# Patient Record
Sex: Male | Born: 1940 | Race: White | Hispanic: No | Smoking: Current every day smoker
Health system: Southern US, Community
[De-identification: ages and names within clinical notes are randomized; demographics above are authoritative.]

## PROBLEM LIST (undated history)

## (undated) DIAGNOSIS — I739 Peripheral vascular disease, unspecified: Secondary | ICD-10-CM

## (undated) DIAGNOSIS — Z85820 Personal history of malignant melanoma of skin: Secondary | ICD-10-CM

## (undated) DIAGNOSIS — C439 Malignant melanoma of skin, unspecified: Secondary | ICD-10-CM

## (undated) DIAGNOSIS — I700 Atherosclerosis of aorta: Secondary | ICD-10-CM

## (undated) DIAGNOSIS — E1142 Type 2 diabetes mellitus with diabetic polyneuropathy: Secondary | ICD-10-CM

## (undated) DIAGNOSIS — K219 Gastro-esophageal reflux disease without esophagitis: Secondary | ICD-10-CM

## (undated) DIAGNOSIS — I10 Essential (primary) hypertension: Secondary | ICD-10-CM

## (undated) DIAGNOSIS — I2119 ST elevation (STEMI) myocardial infarction involving other coronary artery of inferior wall: Secondary | ICD-10-CM

---

## 2001-06-01 ENCOUNTER — Emergency Department (HOSPITAL_COMMUNITY): Admission: EM | Admit: 2001-06-01 | Discharge: 2001-06-01 | Payer: Self-pay | Admitting: Emergency Medicine

## 2001-06-01 ENCOUNTER — Encounter: Payer: Self-pay | Admitting: Emergency Medicine

## 2003-01-17 ENCOUNTER — Encounter: Payer: Self-pay | Admitting: Emergency Medicine

## 2003-01-17 ENCOUNTER — Encounter: Payer: Self-pay | Admitting: Neurology

## 2003-01-17 ENCOUNTER — Emergency Department (HOSPITAL_COMMUNITY): Admission: EM | Admit: 2003-01-17 | Discharge: 2003-01-17 | Payer: Self-pay

## 2003-01-18 ENCOUNTER — Encounter: Payer: Self-pay | Admitting: Neurology

## 2009-08-25 ENCOUNTER — Ambulatory Visit: Payer: Self-pay | Admitting: Diagnostic Radiology

## 2009-08-25 ENCOUNTER — Encounter: Payer: Self-pay | Admitting: Emergency Medicine

## 2009-08-25 ENCOUNTER — Inpatient Hospital Stay (HOSPITAL_COMMUNITY): Admission: EM | Admit: 2009-08-25 | Discharge: 2009-08-27 | Payer: Self-pay | Admitting: Internal Medicine

## 2009-08-26 ENCOUNTER — Encounter (INDEPENDENT_AMBULATORY_CARE_PROVIDER_SITE_OTHER): Payer: Self-pay | Admitting: Internal Medicine

## 2011-02-11 ENCOUNTER — Emergency Department (INDEPENDENT_AMBULATORY_CARE_PROVIDER_SITE_OTHER): Payer: Medicare Other

## 2011-02-11 ENCOUNTER — Inpatient Hospital Stay (HOSPITAL_COMMUNITY)
Admission: AD | Admit: 2011-02-11 | Discharge: 2011-02-15 | DRG: 071 | Disposition: A | Discharge status: Home Health | Payer: Medicare Other | Source: Other Acute Inpatient Hospital | Attending: Internal Medicine | Admitting: Internal Medicine

## 2011-02-11 ENCOUNTER — Emergency Department (HOSPITAL_BASED_OUTPATIENT_CLINIC_OR_DEPARTMENT_OTHER)
Admission: EM | Admit: 2011-02-11 | Discharge: 2011-02-11 | Disposition: A | Discharge status: Nursing Home (Includes ICF) | Payer: Medicare Other | Source: Home / Self Care | Attending: Emergency Medicine | Admitting: Emergency Medicine

## 2011-02-11 DIAGNOSIS — W19XXXA Unspecified fall, initial encounter: Secondary | ICD-10-CM | POA: Diagnosis present

## 2011-02-11 DIAGNOSIS — Z8673 Personal history of transient ischemic attack (TIA), and cerebral infarction without residual deficits: Secondary | ICD-10-CM

## 2011-02-11 DIAGNOSIS — S60229A Contusion of unspecified hand, initial encounter: Secondary | ICD-10-CM | POA: Diagnosis present

## 2011-02-11 DIAGNOSIS — E119 Type 2 diabetes mellitus without complications: Secondary | ICD-10-CM | POA: Diagnosis present

## 2011-02-11 DIAGNOSIS — E785 Hyperlipidemia, unspecified: Secondary | ICD-10-CM | POA: Diagnosis present

## 2011-02-11 DIAGNOSIS — I10 Essential (primary) hypertension: Secondary | ICD-10-CM | POA: Diagnosis present

## 2011-02-11 DIAGNOSIS — R262 Difficulty in walking, not elsewhere classified: Secondary | ICD-10-CM | POA: Diagnosis present

## 2011-02-11 DIAGNOSIS — R4182 Altered mental status, unspecified: Secondary | ICD-10-CM

## 2011-02-11 DIAGNOSIS — N390 Urinary tract infection, site not specified: Secondary | ICD-10-CM | POA: Diagnosis present

## 2011-02-11 DIAGNOSIS — Z9181 History of falling: Secondary | ICD-10-CM

## 2011-02-11 DIAGNOSIS — R4789 Other speech disturbances: Secondary | ICD-10-CM | POA: Insufficient documentation

## 2011-02-11 DIAGNOSIS — M79609 Pain in unspecified limb: Secondary | ICD-10-CM

## 2011-02-11 DIAGNOSIS — R55 Syncope and collapse: Secondary | ICD-10-CM

## 2011-02-11 DIAGNOSIS — Y998 Other external cause status: Secondary | ICD-10-CM

## 2011-02-11 DIAGNOSIS — F29 Unspecified psychosis not due to a substance or known physiological condition: Secondary | ICD-10-CM | POA: Insufficient documentation

## 2011-02-11 DIAGNOSIS — S6390XA Sprain of unspecified part of unspecified wrist and hand, initial encounter: Secondary | ICD-10-CM | POA: Diagnosis present

## 2011-02-11 DIAGNOSIS — F0390 Unspecified dementia without behavioral disturbance: Secondary | ICD-10-CM | POA: Diagnosis present

## 2011-02-11 DIAGNOSIS — D649 Anemia, unspecified: Secondary | ICD-10-CM | POA: Diagnosis present

## 2011-02-11 DIAGNOSIS — E538 Deficiency of other specified B group vitamins: Secondary | ICD-10-CM | POA: Diagnosis present

## 2011-02-11 DIAGNOSIS — Z79899 Other long term (current) drug therapy: Secondary | ICD-10-CM | POA: Insufficient documentation

## 2011-02-11 DIAGNOSIS — G9340 Encephalopathy, unspecified: Principal | ICD-10-CM | POA: Diagnosis present

## 2011-02-11 DIAGNOSIS — M7989 Other specified soft tissue disorders: Secondary | ICD-10-CM

## 2011-02-11 DIAGNOSIS — Z85820 Personal history of malignant melanoma of skin: Secondary | ICD-10-CM

## 2011-02-11 HISTORY — DX: Essential (primary) hypertension: I10

## 2011-02-11 LAB — DIFFERENTIAL
Basophils Relative: 0 % (ref 0–1)
Eosinophils Absolute: 0.1 10*3/uL (ref 0.0–0.7)
Eosinophils Relative: 3 % (ref 0–5)
Monocytes Absolute: 0.4 10*3/uL (ref 0.1–1.0)
Monocytes Relative: 9 % (ref 3–12)

## 2011-02-11 LAB — URINALYSIS, ROUTINE W REFLEX MICROSCOPIC
Bilirubin Urine: NEGATIVE
Hgb urine dipstick: NEGATIVE
Ketones, ur: NEGATIVE mg/dL
Nitrite: NEGATIVE
Specific Gravity, Urine: 1.025 (ref 1.005–1.030)
Urobilinogen, UA: 0.2 mg/dL (ref 0.0–1.0)

## 2011-02-11 LAB — COMPREHENSIVE METABOLIC PANEL
ALT: 14 U/L (ref 0–53)
AST: 31 U/L (ref 0–37)
Albumin: 3.6 g/dL (ref 3.5–5.2)
CO2: 25 mEq/L (ref 19–32)
Chloride: 107 mEq/L (ref 96–112)
GFR calc Af Amer: >60 mL/min (ref >60)
GFR calc non Af Amer: >60 mL/min (ref >60)
Potassium: 4.6 mEq/L (ref 3.5–5.1)
Sodium: 142 mEq/L (ref 135–145)
Total Bilirubin: 0.5 mg/dL (ref 0.3–1.2)

## 2011-02-11 LAB — URINE MICROSCOPIC-ADD ON

## 2011-02-11 LAB — CBC
Hemoglobin: 9.4 g/dL — ABNORMAL LOW (ref 13.0–17.0)
Platelets: 247 10*3/uL (ref 150–400)
RBC: 3.54 MIL/uL — ABNORMAL LOW (ref 4.22–5.81)
WBC: 4.7 10*3/uL (ref 4.0–10.5)

## 2011-02-12 ENCOUNTER — Encounter (HOSPITAL_COMMUNITY): Payer: Self-pay | Admitting: Radiology

## 2011-02-12 ENCOUNTER — Other Ambulatory Visit (HOSPITAL_COMMUNITY): Payer: Non-veteran care

## 2011-02-12 ENCOUNTER — Inpatient Hospital Stay (HOSPITAL_COMMUNITY): Payer: Medicare Other

## 2011-02-12 LAB — MAGNESIUM: Magnesium: 1.3 mg/dL — ABNORMAL LOW (ref 1.5–2.5)

## 2011-02-12 LAB — DRUGS OF ABUSE SCREEN W/O ALC, ROUTINE URINE
Amphetamine Screen, Ur: NEGATIVE
Barbiturate Quant, Ur: POSITIVE — AB
Benzodiazepines.: NEGATIVE
Methadone: NEGATIVE
Phencyclidine (PCP): NEGATIVE

## 2011-02-12 LAB — RPR: RPR Ser Ql: NONREACTIVE

## 2011-02-12 LAB — PROTIME-INR
INR: 0.91 (ref 0.00–1.49)
Prothrombin Time: 12.5 seconds (ref 11.6–15.2)

## 2011-02-12 LAB — SEDIMENTATION RATE: Sed Rate: 18 mm/hr — ABNORMAL HIGH (ref 0–16)

## 2011-02-12 LAB — GLUCOSE, CAPILLARY
Glucose-Capillary: 185 mg/dL — ABNORMAL HIGH (ref 70–99)
Glucose-Capillary: 107 mg/dL — ABNORMAL HIGH (ref 70–99)

## 2011-02-12 LAB — AMMONIA: Ammonia: 29 umol/L (ref 11–35)

## 2011-02-12 LAB — LIPID PANEL
Cholesterol: 153 mg/dL (ref 0–200)
HDL: 42 mg/dL (ref >39)
Total CHOL/HDL Ratio: 3.6 RATIO
Triglycerides: 161 mg/dL — ABNORMAL HIGH (ref <150)

## 2011-02-12 LAB — CARDIAC PANEL(CRET KIN+CKTOT+MB+TROPI)
Relative Index: 1 (ref 0.0–2.5)
Troponin I: 0.01 ng/mL (ref 0.00–0.06)

## 2011-02-12 LAB — HEMOGLOBIN A1C: Hgb A1c MFr Bld: 8.6 % — ABNORMAL HIGH (ref <5.7)

## 2011-02-12 LAB — ABO/RH: ABO/RH(D): O POS

## 2011-02-12 LAB — IRON AND TIBC: Iron: 43 ug/dL (ref 42–135)

## 2011-02-12 LAB — FERRITIN: Ferritin: 12 ng/mL — ABNORMAL LOW (ref 22–322)

## 2011-02-12 LAB — ACETAMINOPHEN LEVEL: Acetaminophen (Tylenol), Serum: <10 ug/mL — ABNORMAL LOW (ref 10–30)

## 2011-02-12 LAB — SALICYLATE LEVEL: Salicylate Lvl: <4 mg/dL (ref 2.8–20.0)

## 2011-02-12 LAB — FOLATE: Folate: 13 ng/mL

## 2011-02-12 LAB — TSH: TSH: 0.939 u[IU]/mL (ref 0.350–4.500)

## 2011-02-13 ENCOUNTER — Inpatient Hospital Stay (HOSPITAL_COMMUNITY): Payer: Medicare Other

## 2011-02-13 ENCOUNTER — Other Ambulatory Visit (HOSPITAL_COMMUNITY): Payer: Non-veteran care

## 2011-02-13 LAB — CBC
Hemoglobin: 9.5 g/dL — ABNORMAL LOW (ref 13.0–17.0)
MCHC: 31.8 g/dL (ref 30.0–36.0)
Platelets: 253 10*3/uL (ref 150–400)
RDW: 15.7 % — ABNORMAL HIGH (ref 11.5–15.5)

## 2011-02-13 LAB — BASIC METABOLIC PANEL
Calcium: 9.6 mg/dL (ref 8.4–10.5)
Creatinine, Ser: 1.01 mg/dL (ref 0.4–1.5)
GFR calc non Af Amer: >60 mL/min (ref >60)
Glucose, Bld: 196 mg/dL — ABNORMAL HIGH (ref 70–99)
Sodium: 137 mEq/L (ref 135–145)

## 2011-02-13 LAB — URINE CULTURE: Culture: NO GROWTH

## 2011-02-13 LAB — GLUCOSE, CAPILLARY
Glucose-Capillary: 179 mg/dL — ABNORMAL HIGH (ref 70–99)
Glucose-Capillary: 182 mg/dL — ABNORMAL HIGH (ref 70–99)

## 2011-02-14 LAB — BASIC METABOLIC PANEL
Calcium: 9.9 mg/dL (ref 8.4–10.5)
GFR calc non Af Amer: >60 mL/min (ref >60)
Potassium: 4 mEq/L (ref 3.5–5.1)
Sodium: 139 mEq/L (ref 135–145)

## 2011-02-14 LAB — CBC
MCHC: 32.1 g/dL (ref 30.0–36.0)
Platelets: 251 10*3/uL (ref 150–400)
RDW: 15.6 % — ABNORMAL HIGH (ref 11.5–15.5)
WBC: 5.2 10*3/uL (ref 4.0–10.5)

## 2011-02-14 LAB — GLUCOSE, CAPILLARY
Glucose-Capillary: 187 mg/dL — ABNORMAL HIGH (ref 70–99)
Glucose-Capillary: 167 mg/dL — ABNORMAL HIGH (ref 70–99)
Glucose-Capillary: 263 mg/dL — ABNORMAL HIGH (ref 70–99)

## 2011-02-15 LAB — CBC
HCT: 31 % — ABNORMAL LOW (ref 39.0–52.0)
MCH: 26.2 pg (ref 26.0–34.0)
MCV: 81.4 fL (ref 78.0–100.0)
Platelets: 249 10*3/uL (ref 150–400)
RBC: 3.81 MIL/uL — ABNORMAL LOW (ref 4.22–5.81)
RDW: 15.5 % (ref 11.5–15.5)
WBC: 6.3 10*3/uL (ref 4.0–10.5)

## 2011-02-15 LAB — BASIC METABOLIC PANEL
BUN: 10 mg/dL (ref 6–23)
Chloride: 105 mEq/L (ref 96–112)
Creatinine, Ser: 0.87 mg/dL (ref 0.4–1.5)
Glucose, Bld: 147 mg/dL — ABNORMAL HIGH (ref 70–99)
Potassium: 3.8 mEq/L (ref 3.5–5.1)

## 2011-02-15 LAB — GLUCOSE, CAPILLARY: Glucose-Capillary: 160 mg/dL — ABNORMAL HIGH (ref 70–99)

## 2011-02-15 LAB — FOLATE RBC: RBC Folate: 446 ng/mL (ref 180–600)

## 2011-02-15 NOTE — Procedures (Signed)
HISTORY:  A 70 year old male with frequent falls, confusion, and slurred speech.  MEDICATIONS:  Aspirin, Protonix, Cipro, Tylenol, Ativan, and Normodyne.  CONDITION OF RECORDING:  This is a 16-channel EEG carried out with the patient in the awake, drowsy, and asleep states.  DESCRIPTION:  The waking background activity consisted of a low voltage, symmetrical, fairly well-organized 7-8 Hz beta activity phase from the parietooccipital and posterior temporal regions.  Low-voltage fast activity, poorly organized was seen anteriorly and at times superimposed on more posterior rhythms.  A mixture of beta and alpha rhythms can be seen from the central and temporal regions.  The patient drowses with slowing to irregular, low voltage theta and beta activity.  The patient goes into a light sleep with symmetrical sleep spindles.  The vertex was a sharp activity and irregular slow activity.  Hypoventilation was not performed.  Intermittent photic stimulation failed to elicit any abnormalities.  IMPRESSION:  This is an abnormal EEG secondary to posterior background slowing.  This finding is consistent with a diffuse gray matter disturbance, it is etiologically nonspecific but may include a dementia among other possibilities.          ______________________________ Thana Farr, MD    ZO:[id number] D:  02/12/2011 19:45:30  T:  02/13/2011 00:49:14  Job #:  [id number]

## 2011-02-16 LAB — CROSSMATCH: Unit division: 0

## 2011-02-16 LAB — GLUCOSE, CAPILLARY: Glucose-Capillary: 157 mg/dL — ABNORMAL HIGH (ref 70–99)

## 2011-02-16 NOTE — Consult Note (Signed)
[name], [name]             ACCOUNT NO.:  [id number]  MEDICAL RECORD NO.:  [id number]           PATIENT TYPE:  I  LOCATION:  3034                         FACILITY:  [facility]  PHYSICIAN:  Thana Farr, MD    DATE OF BIRTH:  01-25-1941  DATE OF CONSULTATION:  02/12/2011 DATE OF DISCHARGE:                                CONSULTATION   CONSULT CALLED BY:  Dr. Janee Morn.  HISTORY:  [name] is a 70 year old male that has a history of multiple falls and was admitted after having an increase in the number of falls in the past few days.  The patient also has some slurred speech and confusion.  The patient is a very poor historian, and actually, with his history he reports that he has not had any recent falls but actually had a fall about a year ago and a fall precipitating this admission.  He describes this fall as being as soon as he get out of the bed. He just fell.  He does not remember what happened, his legs just gave away from under him.  He feels that he has been duped into this hospitalization. From looking at his records, it seems that he was admitted in 2010 with slurred speech and confusion at that time.  There was a question of possible TIA.  Workup at that time did show some carotid stenosis, but no other abnormalities.  Most of the history for this hospitalization must be obtained from the admission H & P since the patient is not either aware or willing to give any information about the recent events.  The patient did seem to be confused on presentation.  He did not know where he was or what he came to the hospital for.  PAST MEDICAL HISTORY: 1. Diabetes. 2. Hypertension. 3. Hyperlipidemia. 4. Amelanotic melanoma. 5. Diverticulosis.  MEDICATIONS: Fioricet, Gabapentin, MOM, Hydroxyzine, HCTZ, Lisinopril, Glipizide, Niacin, Crestor, Epinephrine, Metformin, Actos, Ranitidine, Dicyclomine, Omeprazole  SOCIAL HISTORY:  It seems that the patient lives with a  friend.  He is a smoker.  He has no history of alcohol or illicit drug abuse.  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  PHYSICAL EXAMINATION:  VITAL SIGNS:  Blood pressure 122/75, heart rate 73, respiratory rate 20, temperature 98.3. On mental status testing, the patient is alert.  He does know where he is.  Speech is fluent.  The patient can follow simple commands without difficulty.  Does require some reinforcement three-step commands.  He is extremely tangential and the conversation goes along many rows and just trying to get simple answers to questions.  He is easily distracted. On cranial nerve testing, II disk flat bilaterally.  Visual fields grossly intact.  III, IV, VI extraocular movements intact.  V and VII smile symmetric.  VIII grossly intact.  IX and X positive gag.  XI bilateral shoulder shrug.  XII midline tongue extension. On motor testing, the patient is a 5/5 throughout.  There is normal  tone and bulk.  There is some swelling in the right hand.  Due to the swelling, the patient does give a decreased hand grip on the right at approximately 4+/5.  5/5 hand grip on the left. On sensory testing, pinprick and light touch are intact bilaterally. Deep tendon reflexes are 1+ in the upper extremity, trace at the knees and absent at the ankles.  Plantars are mute bilaterally. On cerebellar testing, finger-to-nose and heel-to-shin intact.  Romberg is negative.  Gait is wide-based.  LABORATORY DATA:  PT and INR, PTT 12.5, 0.9 and 127 respectively. Magnesium 1.3, ammonia 29, CK 269, sodium 142, potassium 4.6, chloride 107, CO2 25, BUN and creatinine 20 and 1.1 respectively.  Glucose 183, total bili 0.5, alk phos 69, SGOT, SGPT 31 and 14 respectively, total protein 6.1, albumin 3.6, calcium 9.5.  White blood cell  count 4.7, platelet count 247,000, hemoglobin and hematocrit 9.4 and 29.2 respectively.  Head CT shows no acute changes.  ASSESSMENT:  [name] is a 70 year old male that has an exam today that is consistent with dementia.  He has home medications gleaning from his history and physical that includes Fioricet, dicyclomine, gabapentin, glipizide, hydrochlorothiazide, hydroxyzine, lisinopril, metformin, niacin, omeprazole, Actos, Ranitidine, Crestor, epinephrine, and milk of magnesia.  Some of these medications may be interfering with his cognitive function such as gabapentin, hydroxyzine, Fioricet.  Also, cannot rule out the possibility that he may not be taking these medications appropriately because of his dementia, which may be further leading to cognitive decline as well.  This would also affect his gait and his speech.  There is though a possibility that there may be some ischemic changes and possibly even an new ischemic event since the patient does have some risk factors which include smoking, hypertension, diabetes and carotid stenosis.  He does not have any particular focality on exam today, though.  Would rule out the possibility of acute ischemic disease.  Dementia workup is indicated as well.  PLAN: 1. I agree with MRI of the brain. 2. EEG. 3. Dementia workup to include TSH, ESR, RPR and B12. 4. Would not perform further stroke workup unless abnormalities noted     with MRI of the brain.          ______________________________ Thana Farr, MD     LR/MEDQ  D:  02/12/2011  T:  02/13/2011  Job:  [id number]  Electronically Signed by Thana Farr MD on 02/16/2011 11:02:16 AM

## 2011-02-18 LAB — BARBITURATE, URINE, CONFIRMATION
Butalbital UR Quant: 9100 ng/mL
Pentobarbital GC/MS Conf: NEGATIVE
Phenobarbital GC/MS Conf: NEGATIVE

## 2011-02-19 NOTE — Discharge Summary (Signed)
NAMEHIROSHI, [name]             ACCOUNT NO.:  [id number]  MEDICAL RECORD NO.:  [id number]           PATIENT TYPE:  I  LOCATION:  3028                         FACILITY:  [facility]  PHYSICIAN:  Ramiro Harvest, MD    DATE OF BIRTH:  1941/01/14  DATE OF ADMISSION:  02/11/2011 DATE OF DISCHARGE:  02/15/2011                        DISCHARGE SUMMARY   PRIMARY CARE PHYSICIAN:  [facility] [place].  DISCHARGE DIAGNOSES: 1. Encephalopathy secondary to dementia plus or minus B12 deficiency,     improved. 2. B12 deficiency. 3. Dementia. 4. Type 2 diabetes, hemoglobin A1c of 8.6. 5. Falls secondary to encephalopathy and B12 deficiency. 6. Right hand contusion versus sprain. 7. Hypertension. 8. History of diverticulitis. 9. Hyperlipidemia. 10.Amelanotic melanoma. 11.Status post back surgery for melanoma. 12.History of increased homocysteine levels. 13.History of transient ischemic attack. 14.History of gout 20 years ago. 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 16.Anemia.  DISCHARGE MEDICATIONS: 1. Vitamin B12 1000 mcg p.o. daily x1 week, then p.o. q. weekly x1     month, then p.o. q. monthly. 2. Butalbital/acetaminophen/caffeine 1 tablet p.o. q.i.d. 3. Dicyclomine 10 mg 2 tablets p.o. daily p.r.n. 4. Gabapentin 400 mg 2 tablets p.o. t.i.d. 5. Glipizide 5 mg p.o. b.i.d. 6. Hydrochlorothiazide 12.5 mg p.o. daily. 7. Hydroxyzine 10 mg 2 tablets p.o. q.i.d. p.r.n. 8. Lisinopril 20 mg p.o. daily. 9. Metformin 500 mg 2 tablets p.o. b.i.d. 10.Niacin CR 500 mg 2 tablets p.o. at bedtime. 11.Omeprazole 20 mg 2 tablets p.o. b.i.d. 12.Pioglitazone 30 mg p.o. daily. 13.Ranitidine 300 mg p.o. at bedtime p.r.n. 14.Rosuvastatin 40 mg half a tablet p.o. at bedtime. 15.Vitamin C 1000 mg p.o. q.a.m. 16.Vitamin D 500 units p.o. q.a.m.  DISPOSITION AND FOLLOWUP:  The patient will be discharged home with home health PT, OT, and a rolling walker.  The  patient is to follow up with PCP in 1 week post discharge.  On followup, the patient will need to be followed up upon on his B12 deficiency.  He also need to be followed up upon on his dementia and may consider MRI as an outpatient as was unable to do during this hospitalization secondary to claustrophobia and also to follow up with the patient's dementia.  Treatment will be deferred to the patient's PCP.  The patient is also going to be discharged home on a hand splint secondary to right hand contusion versus sprain and this can be followed up upon per PCP.  The patient's PCP will need to check CBC to follow up on his hemoglobin and hematocrit.  CONSULTATIONS DONE: 1. Hand Orthopedic consult was done.  The patient was seen in     consultation by Dr. Betha Loa on February 12, 2011. 2. A Neurology consultation was done.  The patient was seen in     consultation by Dr. Thana Farr on February 12, 2011.  PROCEDURES PERFORMED:  CT of the head without contrast was done on February 11, 2011, that showed no acute intracranial abnormality.  X-ray of the right hand was done on February 11, 2011, that showed no acute fracture or subluxation.  Chest x-ray  done on February 11, 2011, showed hiatal hernia. No active cardiopulmonary disease.  Chest x-ray done on February 13, 2011, showed no active lung disease, mild peribronchial thickening, and hiatal hernia.  MRI could not fully be done secondary to the patient's extreme claustrophobia and patient's refusal.  BRIEF ADMISSION HISTORY AND PHYSICAL:  Mr. [name] is a 70 year old gentleman with known history of type 2 diabetes, hypertension, prior history of TIA, and amelanotic melanoma was brought to the ED.  At the ED, discussed with Dr. Osvaldo Shipper that the patient was not doing well.  He was confused and having frequent falls.  Also has swelling in the right hand.  In the ED, the patient had a CT of the head, x-ray of the right hand, and chest  x-ray which were negative.  The patient had some anemia as well.  The patient had been admitted for further workup as the patient had some slurred speech, confusion, difficulty walking, and diagnosis of possible CVA versus drug-induced status had been retained.  At the time of admission, admitting physician felt the patient appeared confused.  He did know his name, but could not recall where he was and where he came from.  He stated that he lived with his friend and frequently goes into confusion.  The patient denied any chest pain.  No shortness of breath.  No nausea.  No vomiting.  No abdominal pain.  No other pain.  No headaches.  No visual symptoms.  The patient did have some slurred speech.  He did not know how long ago these symptoms started.  He stated that he had had falls lasting 2-3 days and denied any abdominal pain or dysuria and no diarrhea.  For the rest of admission history and physical, please see H and P dictated by Dr. Toniann Fail of job number [id number].  HOSPITAL COURSE: 1. Encephalopathy secondary to dementia plus or minus B12 deficiency.     The patient was admitted with encephalopathy.  There was a question     of stroke and as such a stroke workup was undertaken.  Head CT,     which was done was negative.  MRI was attempted on 2-3 separate     occasions, however, was unable to be done secondary to extreme     claustrophobia and the patient's refusal.  The patient was     monitored and followed.  A RPR, which was done for this workup, but     came back nonreactive.  A folate level came back within normal     limits at 13.  Vitamin B12 levels were depressed at 137 and a     ferritin level of 12.  TSH, which was obtained was within normal     limits at 0.939.  The patient was also seen by PT/OT.  The patient     was followed.  The patient improved clinically during the     hospitalization.  A Neurology consultation was obtained.  The     patient was seen in consultation  by Dr. Thana Farr, who agreed     with initial workup of MRI and further dementia workup of TSH, sed     rate, RPR, and B12 results as stated above.  MRI was unable to be     done.  It was recommended per Neurology that MRI could be done as     an outpatient.  It was felt that the patient's symptoms were likely  secondary to dementia and he will follow up with his PCP, at which     time a treatment will be deferred to PCP.  The patient was also     noted to be B12 deficient and started on a B12 IM injections.  The     patient will be discharged home on oral B12 one tablet daily for 1     week and then one tablet weekly for 1 month and then one tablet     monthly.  He will follow up with his PCP as an outpatient for     further management of his B12 of deficiency.  The patient was seen     by PT/OT.  Rolling walker was recommended as well as home health     PT/OT.  The patient improved clinically and was discharged home in     stable and improved condition with followup as stated above. 2. B12 deficiency.  During the patient's workup of encephalopathy as     well as anemia, B12 levels which were obtained were noted to be     depressed.  It was felt this could be partially the etiology of the     patient's symptoms.  The patient was placed on B12 replacement and     was started on IM injections of B12 1000 mcg daily.  He will be     discharged home on oral B12 with followup with his PCP as an     outpatient. 3. Dementia.  On admission, the patient came in with encephalopathy.     The patient was consulted upon per Neurology.  RPR levels, which     were obtained were negative.  B12 levels, which were obtained were     decreased.  Folate was within normal limits.  Thyroid function was     within normal limits.  Head CT was negative.  MRI was unable to be     done secondary to extreme claustrophobia beyond the patient's     refusal.  The patient improved clinically during the      hospitalization.  He will be discharged home.  He will follow up     with his PCP as an outpatient for further management of his     dementia.  Treatment of his dementia will be deferred to the     patient's PCP. 4. Falls was secondary to B12 deficiency and dementia.  The patient     was seen by PT/OT during this hospitalization.  He will be     discharged with home health PT and OT as well as a rolling walker. 5. Right hand contusion versus sprain.  The patient was noted to have     swelling of the right hand on admission and as such a hand     consultation was done.  The patient was seen in consultation by Dr.     Betha Loa on February 12, 2011, who felt that the patient's swelling     had improved and was likely secondary to a right hand contusion     versus sprain secondary to his fall.  It was recommended to elevate     the patient's hand and with range of motion of the digits and wrist     to help with the swelling, which had gone over per hand surgeon     with the patient.  A splint will be placed on the patient prior to     discharge  and he will follow up with his PCP as an outpatient.  The     patient was discharged home with home health OT and will be     monitored.  The rest of the patient's chronic issues remained     stable throughout the hospitalization and the patient was     discharged in stable and improved condition.  On the day of     discharge, vital signs; temperature 98.2, pulse of 90, blood     pressure 138/86 up to 143/105, respirations are 17, satting 96% on     room air.  DISCHARGE LABS:  RBC folate within normal limits of 446.  BMET; sodium 136, potassium 3.8, chloride 105, bicarb 25, glucose 147, BUN 10, creatinine 0.87, calcium of 9.4.  CBC with a white count of 6.3, hemoglobin 10.0, hematocrit 31.2, and a platelet count of 249.  It was a pleasure taking care of Mr. [name].     Ramiro Harvest, MD     DT/MEDQ  D:  02/15/2011  T:  02/15/2011   Job:  [id number]  cc:   Dr. Alger Memos, MD Thana Farr, MD  Electronically Signed by Ramiro Harvest MD on 02/19/2011 12:17:22 PM

## 2011-02-26 NOTE — Consult Note (Signed)
[name], [name]             ACCOUNT NO.:  [id number]  MEDICAL RECORD NO.:  [id number]           PATIENT TYPE:  LOCATION:                                 FACILITY:  PHYSICIAN:  Betha Loa, MD        DATE OF BIRTH:  [birth year]  DATE OF CONSULTATION:  02/12/2011 DATE OF DISCHARGE:                                CONSULTATION   REQUESTING SERVICE:  [facility]  Consult is from hospitalist.  Consult is for right hand swelling.  HISTORY:  Mr. Spurgeon is a 70 year old white male who was admitted last night for CVA versus drug-induced status.  He states he fell 2 days ago. He states he was not unconscious for any period of time.  He has noted some swelling in the right hand.  He has no pain.  He is not very bothered by.  He has had no fevers, chills, or night sweats.  ALLERGIES:  PENICILLIN causes a rash.  PAST MEDICAL HISTORY: 1. Diabetes. 2. Hypertension. 3. Hypercholesterolemia. 4. Transient ischemic attacks. 5. Melanotic melanoma. 6. Gout 20 years ago.  PAST SURGICAL HISTORY:  Cancer removal from left side of head.  MEDICATIONS:  Fioricet, dicyclomine, Neurontin, glipizide, hydrochlorothiazide, hydroxyzine, lisinopril, metformin, niacin, omeprazole, Crestor, Actos, ranitidine, milk of magnesia, and epinephrine injections.  SOCIAL HISTORY:  Positive for tobacco but denies alcohol or drug use.  REVIEW OF SYSTEMS:  A 13-point review of systems negative.  PHYSICAL EXAMINATION:  GENERAL:  Alert and cooperative with examination. Well developed, well nourished, resting comfortably in the hospital bed. EXTREMITIES:  Bilateral upper extremities are distally neurovascularly intact in radial, median, and ulnar distributions.  He has intact sensation and capillary refill in all fingertips.  He can flex and extend the IP joints of his thumbs and cross his fingers.  Left upper extremity is without tenderness to palpation and without any wounds.  In right upper  extremity,  he has some generalized swelling.  He has no wounds.  He is entirely nontender to palpation.  He cannot do full digital flexion secondary to the swelling.  He has full extension.  He can flex and extend at the IP joints of his thumb and can cross his fingers.  There is some mild pinkness at the base of the hand but it is not erythematous.  It is not tender.  It is not tense.  There is a small bruise at the radial side of the base of the thumb.  This is not tender either.  He has good range of motion.  There is no fluctuance.  He has no pain in the anatomic snuffbox, SL ligament, LT ligament, ulnar styloid, or scaphoid tubercle.  He has good range of motion at the wrist without pain.  RADIOGRAPHS:  AP, lateral, and oblique views of the right hand show no fractures, dislocations, or radiopaque foreign bodies.  There is some generalized osteopenia.  ASSESSMENT AND PLAN:  Right hand contusion versus osteoarthritis exacerbation.  I recommend elevation of the right hand with range of motion of the digits and wrist to help with swelling.  I do not see any surgical problems  at this time.     Betha Loa, MD     KK/MEDQ  D:  02/12/2011  T:  02/13/2011  Job:  [id number]  Electronically Signed by Betha Loa  on 02/26/2011 10:39:33 AM

## 2011-03-01 NOTE — H&P (Signed)
[name], [name]             ACCOUNT NO.:  [id number]  MEDICAL RECORD NO.:  [id number]           PATIENT TYPE:  O  LOCATION:  4501                         FACILITY:  [facility]  PHYSICIAN:  Eduard Clos, MDDATE OF BIRTH:  13-May-1941  DATE OF ADMISSION:  02/11/2011 DATE OF DISCHARGE:                             HISTORY & PHYSICAL   History obtained from ER physician and previous chart.  The patient at this time is mildly confused.  CHIEF COMPLAINT:  Slurred speech, confusion and difficulty walking.  HISTORY OF PRESENT ILLNESS:  A 70 year old male with known history of diabetes mellitus type 2, hypertension, previous history of TIA and amelanotic melanoma was brought into ER as the ER physician who had discussed with my colleague, Dr. Osvaldo Shipper that the patient was not doing well, confused, was having frequent falls and also had swelling in the right hand.  In the ER, the patient had a CT head, x-ray of theright hand and chest x-ray all did not show anything acute.  The patient had some anemia which is new.  The patient has been admitted for further workup as the patient has slurred speech, confusion, difficulty walking and diagnosis of possible CVA versus drug-induced status has been detained.  At this time, the patient appears confused.  He knows his name, but does not recall where he is and what he came here for.  He says he lives with his friend and he frequently goes into confusion.  Denies any chest pain, shortness of breath, any nausea, vomiting or abdominal pain. Denies any pain anywhere and denies any headache or visual symptoms.  He does have slurred speech.  He does not know how long ago these symptoms started.  He is saying that he has had falls last 2 or 3 days and denies any abdominal pain, dysuria and she has had diarrhea.  PAST MEDICAL HISTORY:  Diabetes mellitus type 2, hypertension, hyperlipidemia, amelanotic melanoma and diverticulitis.  PAST  SURGICAL HISTORY:  Back surgery for melanoma.  MEDICATIONS:  Prior to admission which as to be verified and as per the ER chart includes; Fioricet, dicyclomine, gabapentin, glipizide, hydrochlorothiazide, hydroxyzine, lisinopril, metformin, niacin, omeprazole, Actos, ranitidine, Crestor, epinephrine injection p.r.n. and milk of magnesia.  FAMILY HISTORY:  Positive for hypertension.  ALLERGIES:  PENICILLIN causes a rash.  REVIEW OF SYSTEMS:  As per history of present illness nothing else significant.  SOCIAL HISTORY:  The patient is a smoker and denies drinking alcohol or using illegal drugs.  PHYSICAL EXAMINATION:  GENERAL:  The patient examined at bedside, not in acute distress. VITAL SIGNS:  Blood pressure 131/76, pulse 85 per minute, temperature 98, respirations 18 and O2 sat is 96%. HEENT:  Anicteric.  No pallor.  No facial asymmetry.  Tongue is midline. There is skin chronic change in the left occipital area probably from a previous surgery.  PLA positive.  The patient is able to count in both eyes.  There is no neck rigidity. CHEST:  Bilateral air entry present.  No rhonchi or crepitation. HEART:  S1 and S2 heard. ABDOMEN:  Soft and nontender.  Bowel sounds are heard. CNS:  The patient is alert, awake, oriented to his name and is moving upper and lower extremity.  The right hand has decreased grips and strength because of the swelling in the right hand.  He is not able to flex the right hand completely.  There is no localized tenderness at this time. EXTREMITIES:  As explained earlier has swelling in the right hand.  He is not able to flex the fingers completely.  He is able to extend it. There is no erythema at this time.  Rest of the extremities looks normal with no acute ischemic changes, cyanosis or clubbing.  LABORATORY DATA:  EKG shows normal sinus rhythm with heart rate around 85 beats per minute with nonspecific ST-T wave changes.  Chest x-ray shows hiatal  hernia and no active cardiopulmonary disease.  X-ray of the right hand shows no acute fracture or subluxation.  CT of the head without contrast.  No acute intracranial abnormality.  CBC; WBC is 4.7, hemoglobin is 9.4, hematocrit is 29.2, platelets 247.  Complete metabolic panel; sodium 142, potassium 4.6, chloride 107, carbon dioxide 25, glucose 183, BUN 20, creatinine 1.1, total bilirubin is 0.5, alkaline phosphatase 69, AST 31, ALT 14, total protein 6.1, albumin 3.6, calcium 9.5.  UA shows urine glucose 100, negative for nitrite, small leukocyte, WBC 3-6, bacteria few.  ASSESSMENT: 1. Slurred speech, altered mental status and frequent falls and     difficulty walking.  Differentials include;     a.     Possible cerebrovascular accident.     b.     Drug induced.     c.     Metabolic. 2. Right hand swelling from fall.  X-rays are negative for fracture. 3. Anemia, normocytic and normochromic. 4. Previous history of transient ischemic attack. 5. Diabetes mellitus type 2. 6. History of hypertension. 7. History of hyperlipidemia. 8. History of cigarette smoking.  PLAN: 1. At this time, we will admit the patient to telemetry. 2. For his altered mental status, slurred speech and difficulty     walking, the patient has failed swallow.  We need to get speech     therapy evaluation.  We are going to get a drug screen ammonia     level.  I am going to also check salicylate and Tylenol level.  The     patient does take Fioricet.  I will check an drug screen and if     there is any barbiturates as Fioricet does contain that.  We do not     if he had overdosed it.  He did come with a friend and we are not     able to reach any of the friends at this time and we are also going     to get MRI of the brain.  The patient will get the neuro checks. 3. Anemia.  We are going to check stool for occult blood anemia panel.     At this time, I am going to type and crossmatch 2 units and hold. 4.  Diabetes mellitus type 2.  The patient has failed swallow.  The     patient will be placed on CBG checks with sensitive sliding scale. 5. Hypertension.  At this time, the patient on p.r.n. IV     antihypertensive. 6. We will get a PT OT and Social Service consult.  The patient     probably may need placement eventually. 7. Further recommendation as condition evolves.     Eduard Clos, MD  ANK/MEDQ  D:  02/11/2011  T:  02/12/2011  Job:  [id number]  Electronically Signed by Midge Minium MD on 03/01/2011 07:54:56 AM

## 2011-03-05 LAB — DIFFERENTIAL
Eosinophils Relative: 3 % (ref 0–5)
Lymphocytes Relative: 23 % (ref 12–46)
Lymphs Abs: 1.8 10*3/uL (ref 0.7–4.0)
Monocytes Absolute: 0.6 10*3/uL (ref 0.1–1.0)
Monocytes Relative: 8 % (ref 3–12)

## 2011-03-05 LAB — URINALYSIS, ROUTINE W REFLEX MICROSCOPIC
Ketones, ur: NEGATIVE mg/dL
Nitrite: NEGATIVE
Specific Gravity, Urine: 1.023 (ref 1.005–1.030)
pH: 6 (ref 5.0–8.0)

## 2011-03-05 LAB — URINE MICROSCOPIC-ADD ON

## 2011-03-05 LAB — GLUCOSE, CAPILLARY
Glucose-Capillary: 145 mg/dL — ABNORMAL HIGH (ref 70–99)
Glucose-Capillary: 154 mg/dL — ABNORMAL HIGH (ref 70–99)
Glucose-Capillary: 135 mg/dL — ABNORMAL HIGH (ref 70–99)

## 2011-03-05 LAB — LIPID PANEL
HDL: 38 mg/dL — ABNORMAL LOW (ref >39)
LDL Cholesterol: 29 mg/dL (ref 0–99)
Total CHOL/HDL Ratio: 2.9 RATIO
Triglycerides: 219 mg/dL — ABNORMAL HIGH (ref <150)
VLDL: 44 mg/dL — ABNORMAL HIGH (ref 0–40)

## 2011-03-05 LAB — COMPREHENSIVE METABOLIC PANEL
AST: 18 U/L (ref 0–37)
Albumin: 3.3 g/dL — ABNORMAL LOW (ref 3.5–5.2)
Calcium: 8.5 mg/dL (ref 8.4–10.5)
Creatinine, Ser: 1.3 mg/dL (ref 0.4–1.5)
GFR calc Af Amer: >60 mL/min (ref >60)
Sodium: 138 mEq/L (ref 135–145)

## 2011-03-05 LAB — CBC
MCHC: 33.3 g/dL (ref 30.0–36.0)
MCHC: 34.5 g/dL (ref 30.0–36.0)
MCV: 89.7 fL (ref 78.0–100.0)
Platelets: 226 10*3/uL (ref 150–400)
RBC: 4.08 MIL/uL — ABNORMAL LOW (ref 4.22–5.81)
RDW: 13.9 % (ref 11.5–15.5)
WBC: 7.9 10*3/uL (ref 4.0–10.5)

## 2011-03-05 LAB — CARDIAC PANEL(CRET KIN+CKTOT+MB+TROPI)
CK, MB: 2.2 ng/mL (ref 0.3–4.0)
Relative Index: INVALID (ref 0.0–2.5)
Total CK: 90 U/L (ref 7–232)

## 2011-03-05 LAB — BASIC METABOLIC PANEL
CO2: 28 mEq/L (ref 19–32)
Calcium: 9 mg/dL (ref 8.4–10.5)
GFR calc Af Amer: >60 mL/min (ref >60)
Glucose, Bld: 95 mg/dL (ref 70–99)
Potassium: 5.2 mEq/L — ABNORMAL HIGH (ref 3.5–5.1)
Sodium: 136 mEq/L (ref 135–145)

## 2011-03-05 LAB — HEMOGLOBIN A1C: Hgb A1c MFr Bld: 7.7 % — ABNORMAL HIGH (ref 4.6–6.1)

## 2013-06-13 ENCOUNTER — Encounter (HOSPITAL_COMMUNITY): Payer: Self-pay | Admitting: Emergency Medicine

## 2013-06-13 ENCOUNTER — Inpatient Hospital Stay (HOSPITAL_COMMUNITY)
Admission: EM | Admit: 2013-06-13 | Discharge: 2013-06-18 | DRG: 689 | Disposition: A | Discharge status: Skilled Nursing Facility | Payer: Medicare Other | Attending: Internal Medicine | Admitting: Internal Medicine

## 2013-06-13 ENCOUNTER — Emergency Department (HOSPITAL_COMMUNITY): Payer: Medicare Other

## 2013-06-13 ENCOUNTER — Inpatient Hospital Stay (HOSPITAL_COMMUNITY): Payer: Medicare Other

## 2013-06-13 ENCOUNTER — Ambulatory Visit (HOSPITAL_COMMUNITY): Payer: Medicare Other

## 2013-06-13 DIAGNOSIS — W19XXXS Unspecified fall, sequela: Secondary | ICD-10-CM

## 2013-06-13 DIAGNOSIS — I10 Essential (primary) hypertension: Secondary | ICD-10-CM | POA: Diagnosis present

## 2013-06-13 DIAGNOSIS — R071 Chest pain on breathing: Secondary | ICD-10-CM | POA: Diagnosis present

## 2013-06-13 DIAGNOSIS — G92 Toxic encephalopathy: Secondary | ICD-10-CM | POA: Diagnosis present

## 2013-06-13 DIAGNOSIS — N390 Urinary tract infection, site not specified: Principal | ICD-10-CM | POA: Diagnosis present

## 2013-06-13 DIAGNOSIS — F411 Generalized anxiety disorder: Secondary | ICD-10-CM | POA: Diagnosis present

## 2013-06-13 DIAGNOSIS — K219 Gastro-esophageal reflux disease without esophagitis: Secondary | ICD-10-CM | POA: Diagnosis present

## 2013-06-13 DIAGNOSIS — F1011 Alcohol abuse, in remission: Secondary | ICD-10-CM | POA: Diagnosis present

## 2013-06-13 DIAGNOSIS — W19XXXA Unspecified fall, initial encounter: Secondary | ICD-10-CM | POA: Diagnosis present

## 2013-06-13 DIAGNOSIS — R4182 Altered mental status, unspecified: Secondary | ICD-10-CM

## 2013-06-13 DIAGNOSIS — Y9210 Unspecified residential institution as the place of occurrence of the external cause: Secondary | ICD-10-CM | POA: Diagnosis present

## 2013-06-13 DIAGNOSIS — W19XXXD Unspecified fall, subsequent encounter: Secondary | ICD-10-CM

## 2013-06-13 DIAGNOSIS — E119 Type 2 diabetes mellitus without complications: Secondary | ICD-10-CM | POA: Diagnosis present

## 2013-06-13 DIAGNOSIS — A498 Other bacterial infections of unspecified site: Secondary | ICD-10-CM | POA: Diagnosis present

## 2013-06-13 DIAGNOSIS — F29 Unspecified psychosis not due to a substance or known physiological condition: Secondary | ICD-10-CM | POA: Diagnosis present

## 2013-06-13 DIAGNOSIS — G9340 Encephalopathy, unspecified: Secondary | ICD-10-CM

## 2013-06-13 DIAGNOSIS — G929 Unspecified toxic encephalopathy: Secondary | ICD-10-CM | POA: Diagnosis present

## 2013-06-13 DIAGNOSIS — E1110 Type 2 diabetes mellitus with ketoacidosis without coma: Secondary | ICD-10-CM

## 2013-06-13 DIAGNOSIS — R5381 Other malaise: Secondary | ICD-10-CM | POA: Diagnosis present

## 2013-06-13 DIAGNOSIS — S20219A Contusion of unspecified front wall of thorax, initial encounter: Secondary | ICD-10-CM | POA: Diagnosis present

## 2013-06-13 DIAGNOSIS — R0789 Other chest pain: Secondary | ICD-10-CM

## 2013-06-13 LAB — HEPATIC FUNCTION PANEL
ALT: 6 U/L (ref 0–53)
Alkaline Phosphatase: 73 U/L (ref 39–117)
Bilirubin, Direct: <0.1 mg/dL (ref 0.0–0.3)

## 2013-06-13 LAB — CBC WITH DIFFERENTIAL/PLATELET
Basophils Relative: 0 % (ref 0–1)
Eosinophils Absolute: 0.1 10*3/uL (ref 0.0–0.7)
HCT: 38.8 % — ABNORMAL LOW (ref 39.0–52.0)
Hemoglobin: 13 g/dL (ref 13.0–17.0)
MCH: 29.6 pg (ref 26.0–34.0)
MCHC: 33.5 g/dL (ref 30.0–36.0)
Monocytes Absolute: 0.7 10*3/uL (ref 0.1–1.0)
Monocytes Relative: 9 % (ref 3–12)
Neutrophils Relative %: 75 % (ref 43–77)

## 2013-06-13 LAB — POCT I-STAT, CHEM 8
Calcium, Ion: 1.31 mmol/L — ABNORMAL HIGH (ref 1.13–1.30)
Creatinine, Ser: 1.2 mg/dL (ref 0.50–1.35)
Glucose, Bld: 272 mg/dL — ABNORMAL HIGH (ref 70–99)
Hemoglobin: 13.6 g/dL (ref 13.0–17.0)
TCO2: 25 mmol/L (ref 0–100)

## 2013-06-13 MED ORDER — ENOXAPARIN SODIUM 40 MG/0.4ML ~~LOC~~ SOLN
40.0000 mg | SUBCUTANEOUS | Status: DC
  Administered 2013-06-13 – 2013-06-17 (×5): 40 mg via SUBCUTANEOUS
  Filled 2013-06-13 (×6): qty 0.4

## 2013-06-13 MED ORDER — FOLIC ACID 1 MG PO TABS
1.0000 mg | ORAL_TABLET | Freq: Every day | ORAL | Status: DC
  Filled 2013-06-13 (×2): qty 1

## 2013-06-13 MED ORDER — LORAZEPAM 1 MG PO TABS
1.0000 mg | ORAL_TABLET | Freq: Four times a day (QID) | ORAL | Status: AC | PRN
  Administered 2013-06-16: 1 mg via ORAL
  Filled 2013-06-13 (×2): qty 1

## 2013-06-13 MED ORDER — ADULT MULTIVITAMIN W/MINERALS CH
1.0000 | ORAL_TABLET | Freq: Every day | ORAL | Status: DC
  Filled 2013-06-13 (×2): qty 1

## 2013-06-13 MED ORDER — THIAMINE HCL 100 MG/ML IJ SOLN
100.0000 mg | Freq: Every day | INTRAMUSCULAR | Status: DC
  Filled 2013-06-13 (×5): qty 1

## 2013-06-13 MED ORDER — LORAZEPAM 2 MG/ML IJ SOLN
1.0000 mg | Freq: Four times a day (QID) | INTRAMUSCULAR | Status: AC | PRN
  Administered 2013-06-13 – 2013-06-15 (×5): 1 mg via INTRAVENOUS
  Filled 2013-06-13 (×5): qty 1

## 2013-06-13 MED ORDER — SENNOSIDES-DOCUSATE SODIUM 8.6-50 MG PO TABS
1.0000 | ORAL_TABLET | Freq: Every evening | ORAL | Status: DC | PRN
  Filled 2013-06-13: qty 1

## 2013-06-13 MED ORDER — VITAMIN B-1 100 MG PO TABS
100.0000 mg | ORAL_TABLET | Freq: Every day | ORAL | Status: DC
  Administered 2013-06-15 – 2013-06-18 (×4): 100 mg via ORAL
  Filled 2013-06-13 (×6): qty 1

## 2013-06-13 NOTE — Progress Notes (Signed)
CSW spoke with nurse.  Per nurse pt alert to person and place.  Per nurse neighbor who made 911 call stated that pt had a nephew named Tomma Lightning who is a IT sales professional, but was unable to give any other info.  CSW called communications and spoke with Alina.  They did a search of [name] and did not find anyone on staff.   She stated that she would call CSW back if they found any additional information.  Neighbor made call from pts home so there was no info available on contact info for neighbor.  CSW awaiting further medical evaluation to determine pts disposition needs.  Marva Panda, Theresia Majors  [phone] .06/13/2013 14:22 pm

## 2013-06-13 NOTE — ED Notes (Signed)
Attempted in and out with [address]. PA Browning at bedside. Unable to advance catheter. PA aware.

## 2013-06-13 NOTE — Progress Notes (Signed)
Patient unable to sign up for My Chart. No family present. Briscoe Burns BSN, RN-BC Admissions RN 06/13/2013 3:39 PM

## 2013-06-13 NOTE — ED Notes (Signed)
Per EMS. Patient from home, lives alone. Neighbor called patient and he did not answer. Neighbor went to patients house and found him to be disorganized, unaware of date, month, year. Patient oriented to self and his birth date. Patient is stuttering, and it is reported this is not his baseline. Patient last known normal was 2 days ago. Patient has old bruising to the left side of his forehead.

## 2013-06-13 NOTE — Progress Notes (Signed)
Spoke with Selena Batten RN in ICU regarding the need for a swallow screen and stroke evaluation after patient returns from radiology

## 2013-06-13 NOTE — H&P (Signed)
[facility]          History and Physical    PCP:   No primary provider on file.   Chief Complaint:  Confusion  HPI: 72 y/o man who is sent to the hospital today for confusion. Unable to obtain any history from the patient given marked confusion and difficulty with word finding. No family present. It was reported to me by EDP that patient's neighbor called him today and he was noted to not be normal and was very confused. In ED workup done so far has been negative including CT Head, CXR, BMET, CBC. UA is pending. We have been asked to admit him for further evaluation and management.   Allergies:  No Known Allergies    Past Medical History  Diagnosis Date  . Diabetes mellitus   . Hypertension     History reviewed. No pertinent past surgical history.  Prior to Admission medications   Not on File    Social History:  reports that  drinks alcohol. His tobacco and drug histories are not on file.  History reviewed. No pertinent family history.  Review of Systems:  Unable to obtain given current mental status.  Physical Exam: Blood pressure 125/78, pulse 60, temperature 98 F (36.7 C), temperature source Oral, resp. rate 16, SpO2 94.00%. Gen: awake, confused, seems to have slurred speech and difficulty with word finding. HEENT: [place], small bruise over left temporo-parietal area, PERRL, poor dentition Neck: supple, no JVD, no LAD, no bruits, no goiter CV: RRR, no M/R/G Lungs: CTA B Abd: S/NT/ND/+BS/no masses noted Ext: no C/C/E, +pedal pulses. Neuro:oriented x 2 (not to time), difficulty with word finding and seems frustrated about it, strength intact    Labs on Admission:  Results for orders placed during the hospital encounter of 06/13/13 (from the past 48 hour(s))  CBC WITH DIFFERENTIAL     Status: Abnormal   Collection Time    06/13/13  1:40 PM      Result Value Range   WBC 8.2  4.0 - 10.5 K/uL   RBC 4.39  4.22 - 5.81 MIL/uL   Hemoglobin 13.0   13.0 - 17.0 g/dL   HCT 40.9 (*) 81.1 - 91.4 %   MCV 88.4  78.0 - 100.0 fL   MCH 29.6  26.0 - 34.0 pg   MCHC 33.5  30.0 - 36.0 g/dL   RDW 78.2  95.6 - 21.3 %   Platelets 173  150 - 400 K/uL   Neutrophils Relative % 75  43 - 77 %   Neutro Abs 6.1  1.7 - 7.7 K/uL   Lymphocytes Relative 15  12 - 46 %   Lymphs Abs 1.2  0.7 - 4.0 K/uL   Monocytes Relative 9  3 - 12 %   Monocytes Absolute 0.7  0.1 - 1.0 K/uL   Eosinophils Relative 1  0 - 5 %   Eosinophils Absolute 0.1  0.0 - 0.7 K/uL   Basophils Relative 0  0 - 1 %   Basophils Absolute 0.0  0.0 - 0.1 K/uL  ETHANOL     Status: None   Collection Time    06/13/13  1:40 PM      Result Value Range   Alcohol, Ethyl (B) <11  0 - 11 mg/dL   Comment:            LOWEST DETECTABLE LIMIT FOR     SERUM ALCOHOL IS 11 mg/dL     FOR MEDICAL PURPOSES ONLY  POCT  I-STAT, CHEM 8     Status: Abnormal   Collection Time    06/13/13  1:47 PM      Result Value Range   Sodium 140  135 - 145 mEq/L   Potassium 3.8  3.5 - 5.1 mEq/L   Chloride 105  96 - 112 mEq/L   BUN 29 (*) 6 - 23 mg/dL   Creatinine, Ser 1.61  0.50 - 1.35 mg/dL   Glucose, Bld 096 (*) 70 - 99 mg/dL   Calcium, Ion 0.45 (*) 1.13 - 1.30 mmol/L   TCO2 25  0 - 100 mmol/L   Hemoglobin 13.6  13.0 - 17.0 g/dL   HCT 40.9  81.1 - 91.4 %  POCT I-STAT TROPONIN I     Status: None   Collection Time    06/13/13  1:55 PM      Result Value Range   Troponin i, poc 0.01  0.00 - 0.08 ng/mL   Comment 3            Comment: Due to the release kinetics of cTnI,     a negative result within the first hours     of the onset of symptoms does not rule out     myocardial infarction with certainty.     If myocardial infarction is still suspected,     repeat the test at appropriate intervals.  HEPATIC FUNCTION PANEL     Status: Abnormal (Preliminary result)   Collection Time    06/13/13  3:39 PM      Result Value Range   Total Protein 6.6  6.0 - 8.3 g/dL   Albumin 3.4 (*) 3.5 - 5.2 g/dL   AST 8  0 - 37 U/L    ALT 6  0 - 53 U/L   Alkaline Phosphatase 73  39 - 117 U/L   Total Bilirubin 0.2 (*) 0.3 - 1.2 mg/dL   Bilirubin, Direct PENDING  0.0 - 0.3 mg/dL   Indirect Bilirubin PENDING  0.3 - 0.9 mg/dL    Radiological Exams on Admission: Ct Head Wo Contrast  06/13/2013   *RADIOLOGY REPORT*  Clinical Data: Altered mental status.  Disorientation.  Bruising. Diabetes.  Hypertension.  CT HEAD WITHOUT CONTRAST  Technique:  Contiguous axial images were obtained from the base of the skull through the vertex without contrast.  Comparison: 02/11/2011; 02/13/2011  Findings: The brain stem, cerebellum, cerebral peduncles, thalami, basal ganglia, basilar cisterns, and ventricular system appear unremarkable.  No intracranial hemorrhage, mass lesion, or acute infarction is identified.  Mild chronic ischemic microvascular white matter disease noted.  IMPRESSION:  1.  No acute intracranial findings. 2.  Mild chronic ischemic microvascular white matter disease.   Original Report Authenticated By: Gaylyn Rong, M.D.   Dg Chest Port 1 View  06/13/2013   *RADIOLOGY REPORT*  Clinical Data: Chest pain.  Diabetic.  PORTABLE CHEST - 1 VIEW  Comparison: 02/13/2011.  Findings: Tortuous calcified aorta.  Central pulmonary vascular prominence.  No segmental infiltrate or gross pneumothorax.  Heart size top normal.  IMPRESSION: Tortuous calcified aorta. If primary aortic abnormality is of concern as a cause of patient's chest pain, CT imaging may then be considered.  Central pulmonary vascular prominence.  No segmental infiltrate or gross pneumothorax.  Heart size top normal.   Original Report Authenticated By: Lacy Duverney, M.D.    Assessment/Plan Principal Problem:   Acute encephalopathy Active Problems:   Chest wall pain   Fall   Acute Encephalopathy -Broad DDx at this time,  however mostly concerned for CVA given word finding aphasia. Also need to consider seizure and possibly ETOH withdrawals (there is a note about  prior ETOH use in the chart). Also acute infection is a possibility, CXR negative and UA is pending. ?drug use, liver disorders, B12 defciciency. A rare but possible cause would also be tertiary syphilis. -Plan to admit to tele, check MRI Brain, EEG, TSH, B12, RPR. Will place on thiamine and will monitor for withdrawals on a CIWA protocol. Will also order a UDS, ammonia level and will add LFTs.  Fall -As evidenced by bruise on left scalp and left chest wall tenderness. -CT head neg for fracture or epidural or subdural hematoma. -PT/OT evals.  DVT Prophylaxis -Lovenox.  Time Spent on Admission: 80 minutes  HERNANDEZ ACOSTA,Jovontae Banko [facility] Pager: [phone] 06/13/2013, 4:28 PM

## 2013-06-13 NOTE — ED Notes (Signed)
Pt changed into gown. Pt asking for his brother. Will consult social worker to try and get brother's info. Pt states he has pain on his L side.

## 2013-06-13 NOTE — ED Notes (Signed)
MWU:[id number]<MW> Expected date:<BR> Expected time:<BR> Means of arrival:<BR> Comments:<BR> 72 y/o M AMS

## 2013-06-13 NOTE — Progress Notes (Signed)
   CARE MANAGEMENT ED NOTE 06/13/2013  Patient:  [name]   Account Number:  [id number]  Date Initiated:  06/13/2013  Documentation initiated by:  Radford Pax  Subjective/Objective Assessment:   Patient found by nieghbor  with confusion     Subjective/Objective Assessment Detail:     Action/Plan:   Action/Plan Detail:   Anticipated DC Date:       Status Recommendation to Physician:   Result of Recommendation:    Other ED Services  Consult Working Plan    DC [employer]  Other    Choice offered to / List presented to:            Status of service:  Completed, signed off  ED Comments:   ED Comments Detail:  EDCM went to speak with patient at bedside.  Patient's brother [name] at bedside phone number [phone]. Patient's brother also provided CM with phone number of neighbor who found patient confused, which is [name] phone number [phone].  As per patient's brother [name], patient is not normally confused and his speech is not as slow as it is now.  Patient is unaware of what day of the week it is or what month it is.  Patient was able to tell [facility] the name of the neighbor "[name]" who found him. Patient recognizes his brother [name] at bedside.  [facility] asked patient if he was in the service.  Patient stated, "Well, I sure was! I was in the [employer]." [facility] shared with patient husband was in the [employer] and patient smiled and stated, "My condolences."  Patient began laughing. [facility] consulted with EDSW Kritin who will follow up with patient.

## 2013-06-13 NOTE — ED Notes (Signed)
3rd attempt for in and out cath by Barbara Cower, RN. Catheter met resistance. PA [facility] aware. No further orders for in and out cath at this time.

## 2013-06-13 NOTE — ED Notes (Signed)
Patient disoriented to time and situation. Patient is aware he is in the hospital is unsure why. Patient with bruising to R head, Left arm and elbow. C/O neck pain, right upper side pain on exam by MD.

## 2013-06-13 NOTE — ED Provider Notes (Signed)
History    CSN: [id number] Arrival date & time 06/13/13  1307  First MD Initiated Contact with Patient 06/13/13 1326     Chief Complaint  Patient presents with  . Altered Mental Status   (Consider location/radiation/quality/duration/timing/severity/associated sxs/prior Treatment) HPI Comments: Patient presents emergency department with chief complaint of altered mental status. Reportedly, patient lives alone, and his neighbor called him daily to see how he is doing. The neighbor states that he called the patient today but the patient did not answer. When the neighbor went to the patient's home, he found the patient to be disorganized, unaware of the date, month, and year. States that the patient was not at his normal baseline mentation. States the patient does not normally stutter, and he is stuttering now. The patient has a bruise to the left side of his forehead and is complaining of left-sided chest pain. He does not have any other localized complaints. History is hard to obtain CT mental status, level V caveat applies.  The history is provided by the patient. No language interpreter was used.   Past Medical History  Diagnosis Date  . Diabetes mellitus   . Hypertension    History reviewed. No pertinent past surgical history. No family history on file. History  Substance Use Topics  . Smoking status: Not on file  . Smokeless tobacco: Not on file  . Alcohol Use: Yes     Comment: "not as a habit, but as a rule I am forced to"    Review of Systems  Unable to perform ROS: Mental status change    Allergies  Review of patient's allergies indicates no known allergies.  Home Medications  No current outpatient prescriptions on file. BP 125/78  Pulse 60  Temp(Src) 98 F (36.7 C) (Oral)  Resp 16  SpO2 94% Physical Exam  Nursing note and vitals reviewed. Constitutional: He is oriented to person, place, and time. He appears well-developed and well-nourished.  HENT:  Head:  Normocephalic and atraumatic.  Right Ear: External ear normal.  Left Ear: External ear normal.  Nose: Nose normal.  Mouth/Throat: Oropharynx is clear and moist. No oropharyngeal exudate.  Bruising to left temporal region, no bleeding  Eyes: Conjunctivae and EOM are normal. Pupils are equal, round, and reactive to light. Right eye exhibits no discharge. Left eye exhibits no discharge. No scleral icterus.  Neck: Normal range of motion. Neck supple. No JVD present.  Cardiovascular: Normal rate, regular rhythm, normal heart sounds and intact distal pulses.  Exam reveals no gallop and no friction rub.   No murmur heard. Pulmonary/Chest: Effort normal and breath sounds normal. No respiratory distress. He has no wheezes. He has no rales. He exhibits no tenderness.  Left-sided chest wall tenderness, no obvious deformity or abnormality, no bruises  Abdominal: Soft. Bowel sounds are normal. He exhibits no distension and no mass. There is no tenderness. There is no rebound and no guarding.  Musculoskeletal: Normal range of motion. He exhibits no edema and no tenderness.  Neurological: He is alert and oriented to person, place, and time. He has normal reflexes.  CN 3-12 intact, patient stutters on exam, he seems to have some expressive aphasia, struggling to find the words that he wants to say, sensation and strength is intact bilaterally, he follows basic commands  Skin: Skin is warm and dry.    ED Course  Procedures (including critical care time) Labs Reviewed  CBC WITH DIFFERENTIAL  URINALYSIS, ROUTINE W REFLEX MICROSCOPIC  ED ECG REPORT  I personally interpreted this EKG   Date: 06/13/2013   Rate: 61  Rhythm: normal sinus rhythm  QRS Axis: normal  Intervals: normal  ST/T Wave abnormalities: normal  Conduction Disutrbances:none  Narrative Interpretation:   Old EKG Reviewed: unchanged  Results for orders placed during the hospital encounter of 06/13/13  CBC WITH DIFFERENTIAL      Result  Value Range   WBC 8.2  4.0 - 10.5 K/uL   RBC 4.39  4.22 - 5.81 MIL/uL   Hemoglobin 13.0  13.0 - 17.0 g/dL   HCT 78.2 (*) 95.6 - 21.3 %   MCV 88.4  78.0 - 100.0 fL   MCH 29.6  26.0 - 34.0 pg   MCHC 33.5  30.0 - 36.0 g/dL   RDW 08.6  57.8 - 46.9 %   Platelets 173  150 - 400 K/uL   Neutrophils Relative % 75  43 - 77 %   Neutro Abs 6.1  1.7 - 7.7 K/uL   Lymphocytes Relative 15  12 - 46 %   Lymphs Abs 1.2  0.7 - 4.0 K/uL   Monocytes Relative 9  3 - 12 %   Monocytes Absolute 0.7  0.1 - 1.0 K/uL   Eosinophils Relative 1  0 - 5 %   Eosinophils Absolute 0.1  0.0 - 0.7 K/uL   Basophils Relative 0  0 - 1 %   Basophils Absolute 0.0  0.0 - 0.1 K/uL  ETHANOL      Result Value Range   Alcohol, Ethyl (B) <11  0 - 11 mg/dL  POCT I-STAT, CHEM 8      Result Value Range   Sodium 140  135 - 145 mEq/L   Potassium 3.8  3.5 - 5.1 mEq/L   Chloride 105  96 - 112 mEq/L   BUN 29 (*) 6 - 23 mg/dL   Creatinine, Ser 6.29  0.50 - 1.35 mg/dL   Glucose, Bld 528 (*) 70 - 99 mg/dL   Calcium, Ion 4.13 (*) 1.13 - 1.30 mmol/L   TCO2 25  0 - 100 mmol/L   Hemoglobin 13.6  13.0 - 17.0 g/dL   HCT 24.4  01.0 - 27.2 %  POCT I-STAT TROPONIN I      Result Value Range   Troponin i, poc 0.01  0.00 - 0.08 ng/mL   Comment 3            Ct Head Wo Contrast  06/13/2013   *RADIOLOGY REPORT*  Clinical Data: Altered mental status.  Disorientation.  Bruising. Diabetes.  Hypertension.  CT HEAD WITHOUT CONTRAST  Technique:  Contiguous axial images were obtained from the base of the skull through the vertex without contrast.  Comparison: 02/11/2011; 02/13/2011  Findings: The brain stem, cerebellum, cerebral peduncles, thalami, basal ganglia, basilar cisterns, and ventricular system appear unremarkable.  No intracranial hemorrhage, mass lesion, or acute infarction is identified.  Mild chronic ischemic microvascular white matter disease noted.  IMPRESSION:  1.  No acute intracranial findings. 2.  Mild chronic ischemic microvascular white  matter disease.   Original Report Authenticated By: Gaylyn Rong, M.D.   Dg Chest Port 1 View  06/13/2013   *RADIOLOGY REPORT*  Clinical Data: Chest pain.  Diabetic.  PORTABLE CHEST - 1 VIEW  Comparison: 02/13/2011.  Findings: Tortuous calcified aorta.  Central pulmonary vascular prominence.  No segmental infiltrate or gross pneumothorax.  Heart size top normal.  IMPRESSION: Tortuous calcified aorta. If primary aortic abnormality is of concern as a cause of patient's chest  pain, CT imaging may then be considered.  Central pulmonary vascular prominence.  No segmental infiltrate or gross pneumothorax.  Heart size top normal.   Original Report Authenticated By: Lacy Duverney, M.D.     1. Altered mental status     MDM  Patient with AMS.  No clear source.  UA, UDS, and LFTs are pending.  Discussed patient with hospitalist.  Will admit patient.     Roxy Horseman, PA-C 06/13/13 1553

## 2013-06-13 NOTE — ED Notes (Signed)
Attempted to catheterize patient, met resistance with blood return. RN and PA notified.

## 2013-06-14 ENCOUNTER — Inpatient Hospital Stay (HOSPITAL_COMMUNITY)
Admit: 2013-06-14 | Discharge: 2013-06-14 | Disposition: A | Discharge status: Home / Self Care | Payer: Medicare Other | Attending: Internal Medicine | Admitting: Internal Medicine

## 2013-06-14 ENCOUNTER — Inpatient Hospital Stay (HOSPITAL_COMMUNITY): Payer: Medicare Other

## 2013-06-14 ENCOUNTER — Other Ambulatory Visit (HOSPITAL_COMMUNITY): Payer: Non-veteran care

## 2013-06-14 DIAGNOSIS — N390 Urinary tract infection, site not specified: Principal | ICD-10-CM

## 2013-06-14 DIAGNOSIS — E1310 Other specified diabetes mellitus with ketoacidosis without coma: Secondary | ICD-10-CM

## 2013-06-14 DIAGNOSIS — R4182 Altered mental status, unspecified: Secondary | ICD-10-CM

## 2013-06-14 DIAGNOSIS — I10 Essential (primary) hypertension: Secondary | ICD-10-CM

## 2013-06-14 LAB — LIPID PANEL
Cholesterol: 157 mg/dL (ref 0–200)
HDL: 39 mg/dL — ABNORMAL LOW (ref >39)
Total CHOL/HDL Ratio: 4 RATIO
Triglycerides: 252 mg/dL — ABNORMAL HIGH (ref <150)

## 2013-06-14 LAB — URINE MICROSCOPIC-ADD ON

## 2013-06-14 LAB — URINALYSIS, ROUTINE W REFLEX MICROSCOPIC
Nitrite: POSITIVE — AB
Specific Gravity, Urine: 1.036 — ABNORMAL HIGH (ref 1.005–1.030)
pH: 5.5 (ref 5.0–8.0)

## 2013-06-14 LAB — RPR: RPR Ser Ql: NONREACTIVE

## 2013-06-14 LAB — HEMOGLOBIN A1C
Hgb A1c MFr Bld: 7.4 % — ABNORMAL HIGH (ref <5.7)
Mean Plasma Glucose: 166 mg/dL — ABNORMAL HIGH (ref <117)

## 2013-06-14 LAB — RAPID URINE DRUG SCREEN, HOSP PERFORMED
Benzodiazepines: NOT DETECTED
Cocaine: NOT DETECTED

## 2013-06-14 MED ORDER — INSULIN ASPART 100 UNIT/ML ~~LOC~~ SOLN
0.0000 [IU] | Freq: Three times a day (TID) | SUBCUTANEOUS | Status: DC
  Administered 2013-06-15 – 2013-06-16 (×2): 2 [IU] via SUBCUTANEOUS
  Administered 2013-06-16: 3 [IU] via SUBCUTANEOUS
  Administered 2013-06-16 – 2013-06-18 (×5): 2 [IU] via SUBCUTANEOUS
  Administered 2013-06-18: 3 [IU] via SUBCUTANEOUS

## 2013-06-14 MED ORDER — FOLIC ACID 5 MG/ML IJ SOLN
1.0000 mg | Freq: Every day | INTRAMUSCULAR | Status: DC
  Administered 2013-06-14 – 2013-06-18 (×5): 1 mg via INTRAVENOUS
  Filled 2013-06-14 (×5): qty 0.2

## 2013-06-14 MED ORDER — DEXTROSE-NACL 5-0.45 % IV SOLN
INTRAVENOUS | Status: DC
  Administered 2013-06-14: 50 mL/h via INTRAVENOUS
  Administered 2013-06-15 – 2013-06-17 (×2): via INTRAVENOUS

## 2013-06-14 MED ORDER — DEXTROSE 5 % IV SOLN
1.0000 g | INTRAVENOUS | Status: DC
  Administered 2013-06-14 – 2013-06-17 (×4): 1 g via INTRAVENOUS
  Filled 2013-06-14 (×5): qty 10

## 2013-06-14 NOTE — Procedures (Signed)
EEG report.  Brief clinical history: 72 years old admitted to the hospital with altered mental status.  Technique: this is a 17 channel routine scalp EEG performed at the bedside with bipolar and monopolar montages arranged in accordance to the international 10/20 system of electrode placement. One channel was dedicated to EKG recording.  The study was performed during wakefulness, drowsiness, and stage 2 sleep. No activating procedures performed.  Description: As the study begins and throughout the entire recording, there is diffuse, continuous, monomorphic, poorly reactive slowing predominantly in the theta range. This slowing doesn't follow an ictal pattern at any time. No focal or generalized epileptiform discharges noted.   EKG showed sinus rhythm.  Impression: this is an abnormal EEG with findings consistent with a mild to moderate encephalopathy, non specific as to cause. No evidence of electrographic seizures.  Clinical correlation is advised.  Wyatt Portela, MD

## 2013-06-14 NOTE — Progress Notes (Signed)
Offsite portable EEG completed at [facility]. 

## 2013-06-14 NOTE — Progress Notes (Signed)
Clinical Social Work  CSW attempted to meet with patient at bedside. Patient is unable to fully participate in assessment. CSW called patient's brother Billey Gosling [phone]) and left a message with CSW contact information. CSW will continue to follow.  [place], [place] [phone]

## 2013-06-14 NOTE — Progress Notes (Signed)
[facility] PROGRESS NOTE  [name] ZOX:[id number] DOB: 1941/10/25 DOA: 06/13/2013 PCP: No primary provider on file.  Assessment/Plan: 1-Acute encephalopathy: persistent. Mainly with difficulty finding words and cognitively confused. Could be toxic due to UTI vs CVA -TSH, B12, RPR and ammonia WNL -MRI pending -CT head w/o acute intracranial abnormalities -will start empiric tx with rocephin and follow urine cx's -continue to be NPO. Will start d51/2 NS maintenance  2-UTI: will start rocephin and follow urine cx's  3-DM: will use SSI; A1C 7.4  4-HTN: stable and well controlled. Will continue holding oral antihypertensive agents.  5-Hx of alcohol abuse: will continue thiamine and folic acid  6-physical deconditioning: per PT/OT recommendations will benefit on SNF at discharge. Will follow clinical response and improvement.  DVT: lovenox  Code Status: Full Family Communication: no family at bedside Disposition Plan: to be determine (but most likely will required SNF)   Consultants:  PT  Procedures:  See below for x-ray reports  Antibiotics:  none  HPI/Subjective: Patient still confused, no fever; no CP or SOB  Objective: Filed Vitals:   06/14/13 0252 06/14/13 0521 06/14/13 1056 06/14/13 1410  BP: 130/87 133/82 155/99 116/80  Pulse: 65 96 69 76  Temp: 98.7 F (37.1 C) 97.4 F (36.3 C)  98.4 F (36.9 C)  TempSrc: Oral Axillary  Oral  Resp: 20 20 20 20   Height:      Weight:      SpO2: 93% 97% 94% 94%    Intake/Output Summary (Last 24 hours) at 06/14/13 1618 Last data filed at 06/13/13 2035  Gross per 24 hour  Intake      0 ml  Output    400 ml  Net   -400 ml   Filed Weights   06/13/13 1724  Weight: 74.8 kg (164 lb 14.5 oz)    Exam:   General:  AAOX1, confused; no fever  Cardiovascular: S1 and S2, no rubs, no gallops  Respiratory: CTA  Abdomen: soft, ND, positive BS  Musculoskeletal: no edema, no cyanosis  Data  Reviewed: Basic Metabolic Panel:  Recent Labs Lab 06/13/13 1347  NA 140  K 3.8  CL 105  GLUCOSE 272*  BUN 29*  CREATININE 1.20   Liver Function Tests:  Recent Labs Lab 06/13/13 1539  AST 8  ALT 6  ALKPHOS 73  BILITOT 0.2*  PROT 6.6  ALBUMIN 3.4*    Recent Labs Lab 06/13/13 1732  AMMONIA 19   CBC:  Recent Labs Lab 06/13/13 1340 06/13/13 1347  WBC 8.2  --   NEUTROABS 6.1  --   HGB 13.0 13.6  HCT 38.8* 40.0  MCV 88.4  --   PLT 173  --    Studies: Dg Chest 2 View  06/13/2013   *RADIOLOGY REPORT*  Clinical Data: Stroke.  Weakness and confusion  CHEST - 2 VIEW  Comparison: 06/13/2013  Findings: Heart size is normal.  No pleural effusion or edema. There is a tortuous and unfolded thoracic aorta noted.  No airspace consolidation identified.  Visualized osseous structures are grossly unremarkable.  IMPRESSION:  1.  No acute cardiopulmonary abnormalities.   Original Report Authenticated By: Signa Kell, M.D.   Ct Head Wo Contrast  06/13/2013   *RADIOLOGY REPORT*  Clinical Data: Altered mental status.  Disorientation.  Bruising. Diabetes.  Hypertension.  CT HEAD WITHOUT CONTRAST  Technique:  Contiguous axial images were obtained from the base of the skull through the vertex without contrast.  Comparison: 02/11/2011; 02/13/2011  Findings: The brain stem,  cerebellum, cerebral peduncles, thalami, basal ganglia, basilar cisterns, and ventricular system appear unremarkable.  No intracranial hemorrhage, mass lesion, or acute infarction is identified.  Mild chronic ischemic microvascular white matter disease noted.  IMPRESSION:  1.  No acute intracranial findings. 2.  Mild chronic ischemic microvascular white matter disease.   Original Report Authenticated By: Gaylyn Rong, M.D.   Dg Chest Port 1 View  06/13/2013   *RADIOLOGY REPORT*  Clinical Data: Chest pain.  Diabetic.  PORTABLE CHEST - 1 VIEW  Comparison: 02/13/2011.  Findings: Tortuous calcified aorta.  Central pulmonary  vascular prominence.  No segmental infiltrate or gross pneumothorax.  Heart size top normal.  IMPRESSION: Tortuous calcified aorta. If primary aortic abnormality is of concern as a cause of patient's chest pain, CT imaging may then be considered.  Central pulmonary vascular prominence.  No segmental infiltrate or gross pneumothorax.  Heart size top normal.   Original Report Authenticated By: Lacy Duverney, M.D.    Scheduled Meds: . enoxaparin (LOVENOX) injection  40 mg Subcutaneous Q24H  . folic acid  1 mg Oral Daily  . multivitamin with minerals  1 tablet Oral Daily  . thiamine  100 mg Oral Daily   Or  . thiamine  100 mg Intravenous Daily   Continuous Infusions:   Principal Problem:   Acute encephalopathy Active Problems:   Chest wall pain   Fall    Time spent: >30 minutes   [name]  [facility] Pager [phone]. If 7PM-7AM, please contact night-coverage at [email or web address], password [facility] 06/14/2013, 4:18 PM  LOS: 1 day

## 2013-06-14 NOTE — Evaluation (Signed)
Occupational Therapy Evaluation Patient Details Name: [name] MRN: [id number] DOB: 03-03-1941 Today's Date: 06/14/2013 Time: 4098-1191 OT Time Calculation (min): 15 min  OT Assessment / Plan / Recommendation History of present illness 72 y/o man who is sent to the hospital today for confusion. Currently being worked up for possible CVA   Clinical Impression   *This 72 year old man was admitted for confusion.  Limited OT eval due to pt groaning in pain and limited following commands.  Will trial OT to see if pt will benefit from our services.      OT Assessment  Patient needs continued OT Services (will trial OT)    Follow Up Recommendations  SNF    Barriers to Discharge Decreased caregiver support    Equipment Recommendations   (to be further assessed)    Recommendations for Other Services    Frequency  Min 2X/week    Precautions / Restrictions Precautions Precautions: Fall Restrictions Weight Bearing Restrictions: No   Pertinent Vitals/Pain Pt groaned. Nodded head for pain but could not point to where.  Alerted RN.  RN reports pt groans whenever he is touched.      ADL  Grooming: Performed;Wash/dry hands;+1 Total assistance ADL Comments: Pt briefly held washcloth with LUE but did not use functionally.  Left it on his chin.      OT Diagnosis: Generalized weakness  OT Problem List: Decreased strength;Decreased activity tolerance;Decreased cognition;Pain (balance, vision, coordination NT) OT Treatment Interventions: Self-care/ADL training;Therapeutic activities;Cognitive remediation/compensation;Patient/family education   OT Goals(Current goals can be found in the care plan section) Acute Rehab OT Goals Patient Stated Goal: unable OT Goal Formulation: Patient unable to participate in goal setting Time For Goal Achievement: 06/28/13 Potential to Achieve Goals: Fair ADL Goals Pt Will Perform Grooming: with min assist;sitting (multimodal cues) Additional ADL Goal  #1: pt will follow 25% of commands in context with multimodal cues and assistance to initiate Additional ADL Goal #2: Pt will roll to bil sides for adls with mod A, multimodal cues  Visit Information  Last OT Received On: 06/14/13 Assistance Needed: +2 PT/OT Co-Evaluation/Treatment: Yes        Prior Functioning     Home Living Family/patient expects to be discharged to:: Unsure Living Arrangements: Alone Type of Home: House Additional Comments: Limited info available. Most gathered from chart. Pt unable to provide hx/PLOF Prior Function Level of Independence:  (lived alone, likely mod I to I) Comments: Pt unable to provide hx/PLOF Communication Communication: Receptive difficulties;Expressive difficulties (did not follow many commands; moaned; expressed some short words/phrases.  Nodded head at times.  ) Dominant Hand:  (unknown)         Vision/Perception Vision - Assessment Additional Comments: unable to assess Praxis Praxis:  (unable to assess)   Cognition  Cognition Arousal/Alertness: Lethargic Behavior During Therapy: Agitated Overall Cognitive Status: Difficult to assess Difficult to assess due to: Level of arousal (level of cognition)    Extremity/Trunk Assessment Upper Extremity Assessment Upper Extremity Assessment: Defer to OT evaluation RUE Deficits / Details: moves bil UEs independently; AAROM for bil UEs WFLs. Unable to formally assess strength LUE Deficits / Details: see LUE Lower Extremity Assessment Lower Extremity Assessment: Difficult to assess due to impaired cognition (observed pt to move LEs )     Mobility Bed Mobility Bed Mobility: Rolling Right;Rolling Left Rolling Right: 2: Max assist Rolling Left: 2: Max assist Details for Bed Mobility Assistance: Multimodal cues for technique, hand placement. assist for trunk and LEs. Utilized bedpad for scooting, positioning.  Transfers Details for Transfer Assistance: Unable-pt repeatedly  moaned/groaned loudly. When asked to sit up, pt would just state "what for?" or "no" or just shake his head no.      Exercise     Balance     End of Session OT - End of Session Activity Tolerance: Patient limited by fatigue;Patient limited by pain Patient left: in bed;with bed alarm set  GO     Brylynn Hanssen 06/14/2013, 1:27 PM Marica Otter, OTR/L [phone] 06/14/2013

## 2013-06-14 NOTE — Evaluation (Addendum)
Physical Therapy Evaluation Patient Details Name: [name] MRN: [id number] DOB: 1941-02-11 Today's Date: 06/14/2013 Time: 4098-1191 PT Time Calculation (min): 16 min  PT Assessment / Plan / Recommendation History of Present Illness  72 y/o man who is sent to the hospital today for confusion. Unable to obtain any history from the patient given marked confusion and difficulty with word finding. No family present. It was reported to me by EDP that patient's neighbor called him today and he was noted to not be normal and was very confused. In ED workup done so far has been negative including CT Head, CXR, BMET, CBC. UA is pending. We have been asked to admit him for further evaluation and management. Currently being worked up for possible CVA  Clinical Impression  Bed level eval. +2 assist for bed mobility. Pt would not participate further at this time. Not following commands. Recommend SNF for continued rehab. Will follow and see if pt progresses and participates with therapy.     PT Assessment  Patient needs continued PT services    Follow Up Recommendations  SNF    Does the patient have the potential to tolerate intense rehabilitation      Barriers to Discharge        Equipment Recommendations   (to be determined)    Recommendations for Other Services OT consult   Frequency Min 3X/week    Precautions / Restrictions Precautions Precautions: Fall Restrictions Weight Bearing Restrictions: No   Pertinent Vitals/Pain Pt moans/groans but does not indicate if and where he has pain.      Mobility  Bed Mobility Bed Mobility: Rolling Right;Rolling Left Rolling Right: 2: Max assist Rolling Left: 2: Max assist Details for Bed Mobility Assistance: Multimodal cues for technique, hand placement. assist for trunk and LEs. Utilized bedpad for scooting, positioning.  Transfers Transfers: Not assessed Details for Transfer Assistance: Unable-pt repeatedly moaned/groaned loudly. When  asked to sit up, pt would just state "what for?" or "no" or just shake his head no.  Ambulation/Gait Ambulation/Gait Assistance: Not tested (comment)    Exercises     PT Diagnosis: Difficulty walking;Altered mental status;Generalized weakness  PT Problem List: Decreased mobility;Decreased activity tolerance;Decreased cognition;Decreased knowledge of use of DME;Decreased strength PT Treatment Interventions: DME instruction;Gait training;Functional mobility training;Therapeutic activities;Therapeutic exercise;Balance training;Patient/family education     PT Goals(Current goals can be found in the care plan section) Acute Rehab PT Goals Patient Stated Goal: none stated-pt unable PT Goal Formulation: Patient unable to participate in goal setting Time For Goal Achievement: 06/28/13 Potential to Achieve Goals: Fair  Visit Information  Last PT Received On: 06/14/13 Assistance Needed: +2 PT/OT Co-Evaluation/Treatment: Yes History of Present Illness: 72 y/o man who is sent to the hospital today for confusion. Unable to obtain any history from the patient given marked confusion and difficulty with word finding. No family present. It was reported to me by EDP that patient's neighbor called him today and he was noted to not be normal and was very confused. In ED workup done so far has been negative including CT Head, CXR, BMET, CBC. UA is pending. We have been asked to admit him for further evaluation and management. Currently being worked up for possible CVA       Prior Functioning  Home Living Family/patient expects to be discharged to:: Unsure Living Arrangements: Alone Type of Home: House Additional Comments: Limited info available. Most gathered from chart. Pt unable to provide hx/PLOF Prior Function Level of Independence:  (lived alone, likely mod I  to I) Comments: Pt unable to provide hx/PLOF Communication Communication: Receptive difficulties;Expressive difficulties (did not follow  commands; moaned; expressed some short throug) Dominant Hand:  (unknown)    Cognition  Cognition Arousal/Alertness: Lethargic Behavior During Therapy: Agitated Overall Cognitive Status: Difficult to assess Difficult to assess due to: Level of arousal (level of cognition)    Extremity/Trunk Assessment Upper Extremity Assessment Upper Extremity Assessment: Defer to OT evaluation RUE Deficits / Details: moves bil UEs independently; AAROM for bil UEs WFLs. Unable to formally assess strength LUE Deficits / Details: see LUE Lower Extremity Assessment Lower Extremity Assessment: Difficult to assess due to impaired cognition (observed pt to move LEs )   Balance    End of Session PT - End of Session Activity Tolerance: Other (comment) (Limited by cognition, level of participation) Patient left: in bed;with call bell/phone within reach;with bed alarm set  GP     Rebeca Alert, MPT Pager: [phone]

## 2013-06-14 NOTE — Progress Notes (Signed)
Inpatient Diabetes Program Recommendations  AACE/ADA: New Consensus Statement on Inpatient Glycemic Control (2013)  Target Ranges:  Prepandial:   less than 140 mg/dL      Peak postprandial:   less than 180 mg/dL (1-2 hours)      Critically ill patients:  140 - 180 mg/dL   Reason for Visit: Hyperglycemia, Hx of DM  According to chart review, pt was on metformin 500 bid and glipizide 5mg  bid when discharged from hospital in 2012.  Results for RINGO, SHEROD (MRN [id number]) as of 06/14/2013 09:48  Ref. Range 06/13/2013 13:47  Glucose Latest Range: 70-99 mg/dL 962 (H)    Inpatient Diabetes Program Recommendations Correction (SSI): Please add Novolog sensitive Q4 while NPO then tidwc when diet begins HgbA1C: Pending Diet: CHO mod med when diet advanced  Note: Will follow while inpatient.  Thank you. Ailene Ards, RD, LDN, CDE Inpatient Diabetes Coordinator [phone]

## 2013-06-14 NOTE — Evaluation (Addendum)
Speech Language Pathology Evaluation Patient Details Name: [name] MRN: [id number] DOB: 03/15/1941 Today's Date: 06/14/2013 Time: 4098-1191 SLP Time Calculation (min): 13 min  Problem List:  Patient Active Problem List   Diagnosis Date Noted  . Acute encephalopathy 06/13/2013  . Chest wall pain 06/13/2013  . Fall 06/13/2013   Past Medical History:  Past Medical History  Diagnosis Date  . Diabetes mellitus   . Hypertension    Past Surgical History: History reviewed. No pertinent past surgical history. HPI:  72 yo male adm to [facility] with AMS (neighbors found him in his house after he did not answer phone calls).  Pt CT head negative, CXR negative.  Per ED notes pt with stuttering verbal outpt and inconsisent ability to follow directions.  PMH + for DM, HTN, dementia, melaonoma, falls per chart review from 2013 admission.  Order for speech evaluation received.       Assessment / Plan / Recommendation Clinical Impression  Pt's evaluation was limited by his lethargy, inattention.  RN reports pt received ativan for MRI but that was earlier today.- ? impact on current function.   Pt mostly responds by moaning to any verbal/tactile stimulation.  Suspect his receptive language is intact for basic information as he was able to shake his head yes/no appropriately.  He did not follow commands but did respond verbally "no" with clear voice when nurse technician got blood pressure.   Suspect significant cognitive deficits present, no family here to establsh baseline.  Pt will need further diagnotic treatment during skilled speech therapy - note h/o dementia dx per review of 2012 admission and discharge record.      SLP Assessment  Patient needs continued Speech Lanaguage Pathology Services    Follow Up Recommendations  Skilled Nursing facility    Frequency and Duration min 1 x/week  1 week   Pertinent Vitals/Pain Afebrile, decreased   SLP Goals  SLP Goals Potential to Achieve Goals:  Fair Potential Considerations: Ability to learn/carryover information;Cooperation/participation level;Previous level of function;Severity of impairments SLP Goal #1: Pt will maintain attention for 30 seconds to basic tasks with max verbal stimulation.  SLP Goal #2: Pt will participate in basic problem solving activities in his current environment with maximum cues.    SLP Evaluation Prior Functioning  Type of Home: House   Cognition  Overall Cognitive Status: Difficult to assess Arousal/Alertness: Lethargic Orientation Level: Oriented to person;Oriented to place (via yes/no questions) Attention: Focused Focused Attention: Impaired Focused Attention Impairment: Verbal basic;Functional basic Behaviors: Poor frustration tolerance;Impulsive Safety/Judgment: Impaired (pt's lethargic but difficult to get him to allow tech to tak)    Comprehension  Auditory Comprehension Overall Auditory Comprehension: Other (comment) (difficult to assess) Yes/No Questions: Impaired Basic Biographical Questions: 76-100% accurate Basic Immediate Environment Questions: 75-100% accurate Commands:  (did not follow commands, suspect attn,lethargy) Conversation:  (pt not at this level) Interfering Components: Attention EffectiveTechniques: Increased volume Visual Recognition/Discrimination Discrimination: Not tested Reading Comprehension Reading Status: Not tested    Expression Expression Primary Mode of Expression: Verbal Verbal Expression Overall Verbal Expression: Impaired Initiation: Impaired Repetition: Impaired Level of Impairment: Word level Naming: Impairment Responsive: 0-25% accurate Confrontation: Impaired Convergent: 0-24% accurate Pragmatics: Impairment Other Verbal Expression Comments: pt not verbalizing except to say "no" and explicatives, pt did not state name Written Expression Dominant Hand:  (unknown)   Oral / Motor Oral Motor/Sensory Function Overall Oral Motor/Sensory  Function:  (pt did not follow directions for oral motor exam)   GO     Donavan Burnet  Lupe Carney Nicholson, MS [facility] SLP [phone]

## 2013-06-14 NOTE — ED Provider Notes (Signed)
Medical screening examination/treatment/procedure(s) were conducted as a shared visit with non-physician practitioner(s) and myself.  I personally evaluated the patient during the encounter.   Patient was unexplained altered mental status.    CT head, labs, chest x-ray showed no acute findings.   Admit.  [name] Hutching, MD 06/14/13 [phone]

## 2013-06-15 ENCOUNTER — Encounter (HOSPITAL_COMMUNITY): Payer: Self-pay

## 2013-06-15 LAB — GLUCOSE, CAPILLARY
Glucose-Capillary: 215 mg/dL — ABNORMAL HIGH (ref 70–99)
Glucose-Capillary: 217 mg/dL — ABNORMAL HIGH (ref 70–99)

## 2013-06-15 LAB — TROPONIN I: Troponin I: <0.3 ng/mL (ref <0.30)

## 2013-06-15 MED ORDER — ACETAMINOPHEN 325 MG PO TABS
650.0000 mg | ORAL_TABLET | Freq: Four times a day (QID) | ORAL | Status: DC | PRN
  Administered 2013-06-15 – 2013-06-16 (×3): 650 mg via ORAL
  Filled 2013-06-15 (×3): qty 2

## 2013-06-15 MED ORDER — PANTOPRAZOLE SODIUM 40 MG PO TBEC
40.0000 mg | DELAYED_RELEASE_TABLET | Freq: Every day | ORAL | Status: DC
  Administered 2013-06-15 – 2013-06-18 (×4): 40 mg via ORAL
  Filled 2013-06-15 (×4): qty 1

## 2013-06-15 NOTE — Progress Notes (Signed)
[facility] PROGRESS NOTE  [name] ZOX:[id number] DOB: 1941/09/16 DOA: 06/13/2013 PCP: No primary provider on file.  Assessment/Plan: 1-Acute toxic encephalopathy: due to UTI. Improving.  -TSH, B12, RPR and ammonia WNL -CT head and MRI w/o acute intracranial abnormalities -will continue UTI tx with rocephin. -more alert and able to follow commands. Will ask SPT for reevaluation and diet rec's. Once eating will d/c d51/2 NS maintenance  2-E. Coli UTI: will continue rocephin and follow urine cx's  3-DM: will use SSI; A1C 7.4  4-HTN: stable and well controlled without meds. Will continue holding oral antihypertensive agents for now.  5-Hx of alcohol abuse: will continue thiamine and folic acid. No signs of withdrawal   6-physical deconditioning: per PT/OT recommendations will benefit on SNF at discharge. Will follow clinical response and improvement.  7- chest discomfort: will start PPI. -cycle CE'z -EKG w/o acute ischemic changes  DVT: lovenox  Code Status: Full Family Communication: no family at bedside Disposition Plan: SNF at discharge   Consultants:  PT  Procedures:  See below for x-ray reports  Antibiotics:  rocephin  HPI/Subjective: Patient is less confuse, no fever; no SOB. Complaining of chest discomfort (mid chest/epigastric area)  Objective: Filed Vitals:   06/14/13 1810 06/14/13 2112 06/15/13 0152 06/15/13 0406  BP: 133/95 129/92 165/92 168/92  Pulse: 84 89 82 88  Temp: 98.4 F (36.9 C) 97.5 F (36.4 C) 98.4 F (36.9 C) 97.8 F (36.6 C)  TempSrc: Oral Axillary Oral Axillary  Resp: 20 20 20 20   Height:      Weight:      SpO2: 94% 96% 96% 97%    Intake/Output Summary (Last 24 hours) at 06/15/13 1527 Last data filed at 06/15/13 0600  Gross per 24 hour  Intake  642.5 ml  Output    200 ml  Net  442.5 ml   Filed Weights   06/13/13 1724  Weight: 74.8 kg (164 lb 14.5 oz)    Exam:   General:  AAOX2, less confused; no fever  and asking for something to eat  Cardiovascular: S1 and S2, no rubs, no gallops  Respiratory: CTA  Abdomen: soft, ND, positive BS  Musculoskeletal: no edema, no cyanosis  Data Reviewed: Basic Metabolic Panel:  Recent Labs Lab 06/13/13 1347  NA 140  K 3.8  CL 105  GLUCOSE 272*  BUN 29*  CREATININE 1.20   Liver Function Tests:  Recent Labs Lab 06/13/13 1539  AST 8  ALT 6  ALKPHOS 73  BILITOT 0.2*  PROT 6.6  ALBUMIN 3.4*    Recent Labs Lab 06/13/13 1732  AMMONIA 19   CBC:  Recent Labs Lab 06/13/13 1340 06/13/13 1347  WBC 8.2  --   NEUTROABS 6.1  --   HGB 13.0 13.6  HCT 38.8* 40.0  MCV 88.4  --   PLT 173  --    Studies: Dg Chest 2 View  06/13/2013   *RADIOLOGY REPORT*  Clinical Data: Stroke.  Weakness and confusion  CHEST - 2 VIEW  Comparison: 06/13/2013  Findings: Heart size is normal.  No pleural effusion or edema. There is a tortuous and unfolded thoracic aorta noted.  No airspace consolidation identified.  Visualized osseous structures are grossly unremarkable.  IMPRESSION:  1.  No acute cardiopulmonary abnormalities.   Original Report Authenticated By: Signa Kell, M.D.   Mr Brain Wo Contrast  06/14/2013   *RADIOLOGY REPORT*  Clinical Data: Altered mental status.  MRI HEAD WITHOUT CONTRAST  Technique:  Multiplanar, multiecho pulse sequences  of the brain and surrounding structures were obtained according to standard protocol without intravenous contrast.  Comparison: 06/13/2013 CT.  02/13/2011 MR.  Findings: The patient is claustrophobic.  Only diffusion sequence was able to be obtained.  This sequence is limited in the frontal region secondary to artifact.  Taking these limitations into account, no acute infarct is noted.  Global atrophy.  No hydrocephalus.  Small vessel disease type changes.  Limited for evaluating for mass or hemorrhage.  No gross abnormality noted.  IMPRESSION: The patient is claustrophobic.  Only diffusion sequence was able to be  obtained.  This sequence is limited in the frontal region secondary to artifact.  Taking these limitations into account, no acute infarct is noted.  Global atrophy.  No hydrocephalus.  Small vessel disease type changes.  Limited for evaluating for mass or hemorrhage.  No gross abnormality noted.   Original Report Authenticated By: Lacy Duverney, M.D.    Scheduled Meds: . cefTRIAXone (ROCEPHIN)  IV  1 g Intravenous Q24H  . enoxaparin (LOVENOX) injection  40 mg Subcutaneous Q24H  . folic acid  1 mg Intravenous Daily  . insulin aspart  0-9 Units Subcutaneous TID WC  . pantoprazole  40 mg Oral Q1200  . thiamine  100 mg Oral Daily   Or  . thiamine  100 mg Intravenous Daily   Continuous Infusions: . dextrose 5 % and 0.45% NaCl 50 mL/hr (06/14/13 1809)    Principal Problem:   Acute encephalopathy Active Problems:   Chest wall pain   Fall    Time spent: >30 minutes   Jaron Czarnecki  [facility] Pager [phone]. If 7PM-7AM, please contact night-coverage at [email or web address], password [facility] 06/15/2013, 3:27 PM  LOS: 2 days

## 2013-06-15 NOTE — Progress Notes (Signed)
Elijah Birk [phone]) patient's neighbor states he has been providing the patient with groceries and making sure he eats daily, checking on him and caring for him.  When the patient is discharged he will no longer be able to care for this gentleman due to issues ongoing with himself.  He would like to relay this information to the social worker so everyone involved knows he will be removing himself from this situation and the family will need to assume caring for the patient.    Relayed name and number to [place], CSW.

## 2013-06-15 NOTE — Evaluation (Signed)
Clinical/Bedside Swallow Evaluation Patient Details  Name: [name] MRN: [id number] Date of Birth: 05-13-1941  Today's Date: 06/15/2013 Time: 4098-1191 SLP Time Calculation (min): 15 min  Past Medical History:  Past Medical History  Diagnosis Date  . Diabetes mellitus   . Hypertension    Past Surgical History: History reviewed. No pertinent past surgical history. HPI:  72 yo male adm to [facility] with AMS (neighbors found him in his house after he did not answer phone calls).  Pt CT head negative, CXR negative.  Per ED notes pt with stuttering verbal outpt and inconsisent ability to follow directions.  PMH + for DM, HTN, dementia, melaonoma, falls per chart review from 2013 admission.  Pt passed an RN stroke swallow screen in ED, but had been lethargic since admit.  SLP received verbal order for bedside swallow evaluation.     Assessment / Plan / Recommendation Clinical Impression  Pt cranial nerve exam was unremarkable for deficits impacting swallowing musculature.   Pt observed consuming a few bites of cracker, icecream, applesauce and juice.  He was noted to have cough at baseline and inconsistent cough while pointing to upper left chest to indicate pain during intake.  He overtly denies reflux or previous/current dysphagia.  Pt was sitting upright at edge of bed at the time.  RN reports all cardiac work up (EKG, cardiac enzymes) was negative.     Recommend pt have soft/ground meats/thin diet due to his dentition) with aspiration and reflux precautions.       Aspiration Risk  Mild    Diet Recommendation Dysphagia 3 (Mechanical Soft);Thin liquid (ground meats)   Liquid Administration via: Cup;Straw Medication Administration: Whole meds with puree Supervision: Patient able to self feed;Intermittent supervision to cue for compensatory strategies Compensations: Slow rate;Small sips/bites;Check for pocketing Postural Changes and/or Swallow Maneuvers: Seated upright 90 degrees     Other  Recommendations Oral Care Recommendations: Oral care QID   Follow Up Recommendations  Skilled Nursing facility    Frequency and Duration min 1 x/week  1 week   Pertinent Vitals/Pain Afebrile, decreased    SLP Swallow Goals Patient will utilize recommended strategies during swallow to increase swallowing safety with: Modified independent assistance   Swallow Study Prior Functional Status   unknown, pt denies dysphagia    General HPI: 72 yo male adm to [facility] with AMS (neighbors found him in his house after he did not answer phone calls).  Pt CT head negative, CXR negative.  Per ED notes pt with stuttering verbal outpt and inconsisent ability to follow directions.  PMH + for DM, HTN, dementia, melaonoma, falls per chart review from 2013 admission.  Pt passed an RN stroke swallow screen in ED, but had been lethargic since admit.  SLP received verbal order for bedside swallow evaluation.   Type of Study: Bedside swallow evaluation Diet Prior to this Study: NPO Temperature Spikes Noted: No Respiratory Status: Supplemental O2 delivered via (comment) History of Recent Intubation: No Behavior/Cognition: Alert;Cooperative;Decreased sustained attention;Lethargic Oral Cavity - Dentition: Missing dentition Self-Feeding Abilities: Able to feed self Patient Positioning: Upright in bed Baseline Vocal Quality: Clear Volitional Cough: Other (Comment) (pt refused to cough) Volitional Swallow: Able to elicit    Oral/Motor/Sensory Function Overall Oral Motor/Sensory Function:  (generalized weakness)   Ice Chips Ice chips: Not tested   Thin Liquid Thin Liquid: Within functional limits Presentation: Self Fed;Cup;Straw    Nectar Thick Nectar Thick Liquid: Not tested   Honey Thick Honey Thick Liquid: Not tested   Puree  Puree: Within functional limits Presentation: Self Fed;Spoon   Solid   GO    Solid: Impaired Presentation: Self Fed Oral Phase Impairments: Reduced lingual  movement/coordination;Impaired anterior to posterior transit Oral Phase Functional Implications: Other (comment) (minimal delay) Pharyngeal Phase Impairments:  (mildly decreased mastication ability d/t lack of dentures) Other Comments: decreased mastication abilities       Donavan Burnet, MS [facility] SLP [phone]

## 2013-06-15 NOTE — Progress Notes (Signed)
Physical Therapy Treatment Patient Details Name: [name] MRN: [id number] DOB: 07-17-1941 Today's Date: 06/15/2013 Time: 6962-9528 PT Time Calculation (min): 32 min  PT Assessment / Plan / Recommendation  PT Comments   Pt more alert today.  Required increased time to orient to place.  Pt does not recall falling. With repeat, functional cueing pt was able to sit to EOB x 7 min as SLP evaluated for swallowing. Assisted pt to standing + 2 assist and amb in hallway using EVA walker.  Positioned in recliner.    Follow Up Recommendations  SNF to achieve prior level of mobility.     Does the patient have the potential to tolerate intense rehabilitation     Barriers to Discharge        Equipment Recommendations       Recommendations for Other Services    Frequency Min 3X/week   Progress towards PT Goals    Plan      Precautions / Restrictions Precautions Precautions: Fall Restrictions Weight Bearing Restrictions: No    Pertinent Vitals/Pain C/o L upper chest pain RN aware    Mobility  Bed Mobility Bed Mobility: Supine to Sit Supine to Sit: 2: Max assist Details for Bed Mobility Assistance: increased time and repeat functional cueing to stay on task and complete.  Transfers Transfers: Sit to Stand;Stand to Sit Sit to Stand: 1: +2 Total assist;From bed Sit to Stand: Patient Percentage: 50% Stand to Sit: 1: +2 Total assist;To bed Details for Transfer Assistance: repeat functional cueing and increased time as pt was easily distracted.  Ambulation/Gait Ambulation/Gait Assistance: 1: +2 Total assist Ambulation/Gait: Patient Percentage: 60% Ambulation Distance (Feet): 75 Feet Assistive device: Eva walker Ambulation/Gait Assistance Details: Used EVA walker for increased safety.  Pt demon very drunken, unsteady gait.  Gait Pattern: Step-to pattern;Step-through pattern;Shuffle;Trunk flexed Gait velocity: decreasd    PT Goals (current goals can now be found in the care  plan section)    Visit Information  Last PT Received On: 06/15/13 Assistance Needed: +2    Subjective Data      Cognition       Balance     End of Session PT - End of Session Equipment Utilized During Treatment: Gait belt   Felecia Shelling  PTA [facility]  Acute  Rehab Pager      [phone]

## 2013-06-15 NOTE — Progress Notes (Addendum)
Clinical Social Work Department CLINICAL SOCIAL WORK PLACEMENT NOTE 06/15/2013  Patient:  [name],[name]  Account Number:  [id number] Admit date:  06/13/2013  Clinical Social Worker:  Unk Lightning, LCSW  Date/time:  06/15/2013 10:00 AM  Clinical Social Work is seeking post-discharge placement for this patient at the following level of care:   SKILLED NURSING   (*CSW will update this form in Epic as items are completed)   06/15/2013  Patient/family provided with [facility] Department of Clinical Social Work's list of facilities offering this level of care within the geographic area requested by the patient (or if unable, by the patient's family).  06/15/2013  Patient/family informed of their freedom to choose among providers that offer the needed level of care, that participate in Medicare, Medicaid or managed care program needed by the patient, have an available bed and are willing to accept the patient.  06/15/2013  Patient/family informed of [facility]' ownership interest in [facility], as well as of the fact that they are under no obligation to receive care at this facility.  PASARR submitted to EDS on 06/15/2013 PASARR number received from EDS on 06/15/2013  FL2 transmitted to all facilities in geographic area requested by pt/family on  06/15/2013 FL2 transmitted to all facilities within larger geographic area on   Patient informed that his/her managed care company has contracts with or will negotiate with  certain facilities, including the following:     Patient/family informed of bed offers received:  06/17/13 Patient chooses bed at [facility] Physician recommends and patient chooses bed at    Patient to be transferred to [facility] on  06/18/13 Patient to be transferred to facility by [facility]  The following physician request were entered in Epic:   Additional Comments:

## 2013-06-15 NOTE — Progress Notes (Signed)
Clinical Social Work Department BRIEF PSYCHOSOCIAL ASSESSMENT 06/15/2013  Patient:  [name],[name]     Account Number:  [id number]     Admit date:  06/13/2013  Clinical Social Worker:  Dennison Bulla  Date/Time:  06/15/2013 10:15 AM  Referred by:  Physician  Date Referred:  06/15/2013 Referred for  SNF Placement   Other Referral:   Interview type:  Patient Other interview type:    PSYCHOSOCIAL DATA Living Status:  ALONE Admitted from facility:   Level of care:   Primary support name:  [name] Primary support relationship to patient:  CHILD, ADULT Degree of support available:   Lacking from dtr since she [place] state. Patient has adequate support from brother and neighbor.    CURRENT CONCERNS Current Concerns  Post-Acute Placement   Other Concerns:    SOCIAL WORK ASSESSMENT / PLAN CSW received referral to assist with DC planning. CSW reviewed chart and met with patient at bedside. CSW introduced myself and explained role.    Patient is able to report that he lived alone prior to admission. Patient is confused about why he was admitted to the hospital and how he arrived to [facility]. Patient is unable to give specific answers to questions about how he lived prior to admission. CSW attempted to discuss SNF placement with patient but patient is not completely oriented. Patient was agreeable to CSW calling his dtr.    CSW spoke with dtr [name]) via phone. Dtr lives out of state and is unsure about patient's mobility prior to admission. CSW explained PT/OT recommendations for SNF placement and dtr feels this is a good idea. CSW explained SNF process and Medicare coverage for SNF. Dtr agreeable to neighbor and patient's brother assisting with making a decision regarding which SNF since they live locally.    CSW completed FL2 and faxed out. CSW will follow up with bed offers.   Assessment/plan status:  Psychosocial Support/Ongoing Assessment of Needs Other assessment/ plan:    Information/referral to community resources:   SNF information    PATIENT'S/FAMILY'S RESPONSE TO PLAN OF CARE: Patient alert but somewhat disoriented. Patient is unable to fully participate in assessment. Dtr engaged throughout assessment and thanked CSW for call. Dtr is concerned about patient's health and is frustrated that information regarding patient's health is not able to be released via phone. Dtr agreeable to SNF placement.

## 2013-06-15 NOTE — Progress Notes (Signed)
Pt states he does not want to sign up for My Chart. Briscoe Burns BSN, RN-BC Admissions RN 06/15/2013 10:44 AM

## 2013-06-15 NOTE — Progress Notes (Signed)
INITIAL NUTRITION ASSESSMENT  DOCUMENTATION CODES Per approved criteria  -Not Applicable   INTERVENTION: Diet advancement per MD discretion RD to monitor po intake and provide supplements if needed  NUTRITION DIAGNOSIS: Inadequate oral intake related to inability to eat as evidenced by NPO status since 7/16.   Goal: Pt to meet >/= 90% of their estimated nutrition needs  Monitor:  Diet advancement/PO intake Weight Labs  Reason for Assessment: Low Braden  72 y.o. male  Admitting Dx: Acute encephalopathy  ASSESSMENT: 72 year old male admitted to the hospital with altered mental status and found to have UTI. Pt seemingly confused still but, able to provide some history. Pt reports that he usually weighs 172 lbs and states he wasn't eating PTA but, unable to expand further. Pt states he is "past hungry". RN administered medications with applesauce and pt seemed to swallow just fine. Pt denies dysphagia but states it would be easier if he had his teeth; pt's dentures at home and RN asked pt's brother to retrieve them from pt's home.   Height: Ht Readings from Last 1 Encounters:  06/13/13 6\' 1"  (1.854 m)    Weight: Wt Readings from Last 1 Encounters:  06/13/13 164 lb 14.5 oz (74.8 kg)    Ideal Body Weight: 184 lbs  % Ideal Body Weight: 89%  Wt Readings from Last 10 Encounters:  06/13/13 164 lb 14.5 oz (74.8 kg)    Usual Body Weight: 172 lbs  % Usual Body Weight: 95%  BMI:  Body mass index is 21.76 kg/(m^2).  Estimated Nutritional Needs: Kcal: 1840-2090 Protein: 75-90 grams Fluid: 2.3 L  Skin: intact  Diet Order: NPO  EDUCATION NEEDS: -No education needs identified at this time   Intake/Output Summary (Last 24 hours) at 06/15/13 1316 Last data filed at 06/15/13 0600  Gross per 24 hour  Intake  642.5 ml  Output    200 ml  Net  442.5 ml    Last BM: PTA  Labs:   Recent Labs Lab 06/13/13 1347  NA 140  K 3.8  CL 105  BUN 29*  CREATININE 1.20   GLUCOSE 272*    CBG (last 3)   Recent Labs  06/15/13 0002 06/15/13 0522 06/15/13 0756  GLUCAP 180* 215* 186*    Scheduled Meds: . cefTRIAXone (ROCEPHIN)  IV  1 g Intravenous Q24H  . enoxaparin (LOVENOX) injection  40 mg Subcutaneous Q24H  . folic acid  1 mg Intravenous Daily  . insulin aspart  0-9 Units Subcutaneous TID WC  . pantoprazole  40 mg Oral Q1200  . thiamine  100 mg Oral Daily   Or  . thiamine  100 mg Intravenous Daily    Continuous Infusions: . dextrose 5 % and 0.45% NaCl 50 mL/hr (06/14/13 1809)    Past Medical History  Diagnosis Date  . Diabetes mellitus   . Hypertension     History reviewed. No pertinent past surgical history.  Ian Malkin RD, LDN Inpatient Clinical Dietitian Pager: [phone] After Hours Pager: [phone]

## 2013-06-16 ENCOUNTER — Inpatient Hospital Stay (HOSPITAL_COMMUNITY): Payer: Medicare Other

## 2013-06-16 LAB — GLUCOSE, CAPILLARY

## 2013-06-16 LAB — BASIC METABOLIC PANEL
BUN: 18 mg/dL (ref 6–23)
Chloride: 100 mEq/L (ref 96–112)
GFR calc Af Amer: >90 mL/min (ref >90)
Potassium: 3.4 mEq/L — ABNORMAL LOW (ref 3.5–5.1)

## 2013-06-16 LAB — URINE CULTURE: Colony Count: >=100000

## 2013-06-16 MED ORDER — TRAMADOL HCL 50 MG PO TABS
50.0000 mg | ORAL_TABLET | Freq: Four times a day (QID) | ORAL | Status: DC | PRN
  Administered 2013-06-16 – 2013-06-18 (×5): 50 mg via ORAL
  Filled 2013-06-16 (×5): qty 1

## 2013-06-16 MED ORDER — SENNOSIDES-DOCUSATE SODIUM 8.6-50 MG PO TABS
2.0000 | ORAL_TABLET | Freq: Every evening | ORAL | Status: DC | PRN
  Filled 2013-06-16: qty 2

## 2013-06-16 MED ORDER — POTASSIUM CHLORIDE CRYS ER 20 MEQ PO TBCR
40.0000 meq | EXTENDED_RELEASE_TABLET | ORAL | Status: AC
  Administered 2013-06-16 (×2): 40 meq via ORAL
  Filled 2013-06-16 (×2): qty 2

## 2013-06-16 NOTE — Progress Notes (Signed)
[facility] PROGRESS NOTE  [name] AVW:[id number] DOB: 03/06/1941 DOA: 06/13/2013 PCP: No primary provider on file.  Assessment/Plan: 1-Acute toxic encephalopathy: due to UTI. Improving.  -TSH, B12, RPR and ammonia WNL -CT head and MRI w/o acute intracranial abnormalities -will continue UTI tx with rocephin. -mentation and interaction close to baseline -patient is medically for SNF discharge  2-E. Coli UTI: will continue rocephin for now -culture demonstrated microorganism to be pansensitive -will treat for a total of 10 days and at discharge will make transition to ceftin  3-DM: will use SSI; A1C 7.4 -will resume metformin at discharge.  4-HTN: stable and well controlled without meds. Will continue holding oral antihypertensive agents for now.  5-Hx of alcohol abuse: will continue thiamine and folic acid. No signs of withdrawal.   6-physical deconditioning: per PT/OT recommendations will benefit on SNF at discharge. Will follow clinical response and improvement.  7- chest discomfort: will continue PPI. -CE'z negative X3 -EKG and telemetry w/o acute ischemic changes -pain is reproducible with palpation -will repeat CXR -start tramadol for PRN analagesia  DVT: lovenox  Code Status: Full Family Communication: no family at bedside Disposition Plan: SNF at discharge   Consultants:  PT  Procedures:  See below for x-ray reports  Antibiotics:  rocephin  HPI/Subjective: Patient mentation is close to baseline, no fever; no SOB. Continue complaining of chest discomfort (mid chest/epigastric area)  Objective: Filed Vitals:   06/15/13 0406 06/15/13 1547 06/15/13 2200 06/16/13 0630  BP: 168/92 140/95 117/72 134/74  Pulse: 88 94 87 64  Temp: 97.8 F (36.6 C) 97.8 F (36.6 C) 98.1 F (36.7 C) 98.3 F (36.8 C)  TempSrc: Axillary Oral Oral Oral  Resp: 20 20 20 20   Height:      Weight:      SpO2: 97% 96% 93% 92%    Intake/Output Summary (Last 24  hours) at 06/16/13 1028 Last data filed at 06/16/13 0700  Gross per 24 hour  Intake      0 ml  Output    500 ml  Net   -500 ml   Filed Weights   06/13/13 1724  Weight: 74.8 kg (164 lb 14.5 oz)    Exam:   General:  Well oriented (even remember my name and our discussion yesterday), mentation is close to baseline; no fever and tolerating dys 3 diet  Cardiovascular: S1 and S2, no rubs, no gallops  Respiratory: CTA  Abdomen: soft, ND, positive BS  Musculoskeletal: no edema, no cyanosis  Data Reviewed: Basic Metabolic Panel:  Recent Labs Lab 06/13/13 1347 06/16/13 0545  NA 140 138  K 3.8 3.4*  CL 105 100  CO2  --  25  GLUCOSE 272* 164*  BUN 29* 18  CREATININE 1.20 0.81  CALCIUM  --  9.9   Liver Function Tests:  Recent Labs Lab 06/13/13 1539  AST 8  ALT 6  ALKPHOS 73  BILITOT 0.2*  PROT 6.6  ALBUMIN 3.4*    Recent Labs Lab 06/13/13 1732  AMMONIA 19   CBC:  Recent Labs Lab 06/13/13 1340 06/13/13 1347  WBC 8.2  --   NEUTROABS 6.1  --   HGB 13.0 13.6  HCT 38.8* 40.0  MCV 88.4  --   PLT 173  --    Studies: Mr Brain Wo Contrast  06/14/2013   *RADIOLOGY REPORT*  Clinical Data: Altered mental status.  MRI HEAD WITHOUT CONTRAST  Technique:  Multiplanar, multiecho pulse sequences of the brain and surrounding structures were obtained according  to standard protocol without intravenous contrast.  Comparison: 06/13/2013 CT.  02/13/2011 MR.  Findings: The patient is claustrophobic.  Only diffusion sequence was able to be obtained.  This sequence is limited in the frontal region secondary to artifact.  Taking these limitations into account, no acute infarct is noted.  Global atrophy.  No hydrocephalus.  Small vessel disease type changes.  Limited for evaluating for mass or hemorrhage.  No gross abnormality noted.  IMPRESSION: The patient is claustrophobic.  Only diffusion sequence was able to be obtained.  This sequence is limited in the frontal region secondary  to artifact.  Taking these limitations into account, no acute infarct is noted.  Global atrophy.  No hydrocephalus.  Small vessel disease type changes.  Limited for evaluating for mass or hemorrhage.  No gross abnormality noted.   Original Report Authenticated By: Lacy Duverney, M.D.    Scheduled Meds: . cefTRIAXone (ROCEPHIN)  IV  1 g Intravenous Q24H  . enoxaparin (LOVENOX) injection  40 mg Subcutaneous Q24H  . folic acid  1 mg Intravenous Daily  . insulin aspart  0-9 Units Subcutaneous TID WC  . pantoprazole  40 mg Oral Q1200  . potassium chloride  40 mEq Oral Q4H  . thiamine  100 mg Oral Daily   Or  . thiamine  100 mg Intravenous Daily   Continuous Infusions: . dextrose 5 % and 0.45% NaCl 50 mL/hr at 06/15/13 1538    Principal Problem:   Acute encephalopathy Active Problems:   Chest wall pain   Fall   Time spent: >30 minutes   [name]  [facility] Pager [phone]. If 7PM-7AM, please contact night-coverage at [email or web address], password [facility] 06/16/2013, 10:28 AM  LOS: 3 days

## 2013-06-16 NOTE — Progress Notes (Signed)
Gave bed offers.  Pt lives near [facility] and may want to consider rehab there.  [facility] had not responded over [facility] at the time of this writing.  Weekday CSW to follow.  Providence Crosby, LCSWA Clinical Social Work [phone]

## 2013-06-17 DIAGNOSIS — W19XXXA Unspecified fall, initial encounter: Secondary | ICD-10-CM

## 2013-06-17 LAB — GLUCOSE, CAPILLARY
Glucose-Capillary: 154 mg/dL — ABNORMAL HIGH (ref 70–99)
Glucose-Capillary: 167 mg/dL — ABNORMAL HIGH (ref 70–99)
Glucose-Capillary: 169 mg/dL — ABNORMAL HIGH (ref 70–99)
Glucose-Capillary: 190 mg/dL — ABNORMAL HIGH (ref 70–99)
Glucose-Capillary: 204 mg/dL — ABNORMAL HIGH (ref 70–99)

## 2013-06-17 MED ORDER — METHOCARBAMOL 500 MG PO TABS
500.0000 mg | ORAL_TABLET | Freq: Three times a day (TID) | ORAL | Status: DC | PRN
  Administered 2013-06-17 – 2013-06-18 (×3): 500 mg via ORAL
  Filled 2013-06-17 (×3): qty 1

## 2013-06-17 MED ORDER — ISOSORB DINITRATE-HYDRALAZINE 20-37.5 MG PO TABS
1.0000 | ORAL_TABLET | Freq: Two times a day (BID) | ORAL | Status: DC
  Administered 2013-06-17 – 2013-06-18 (×3): 1 via ORAL
  Filled 2013-06-17 (×4): qty 1

## 2013-06-17 NOTE — Progress Notes (Signed)
[facility] PROGRESS NOTE  [name] ZOX:[id number] DOB: 04/08/1941 DOA: 06/13/2013 PCP: No primary provider on file.  Assessment/Plan: 1-Acute toxic encephalopathy: due to UTI. Improving.  -TSH, B12, RPR and ammonia WNL -CT head and MRI w/o acute intracranial abnormalities -will continue UTI tx with rocephin. -mentation and interaction close to baseline -patient is medically for SNF discharge  2-E. Coli UTI: will continue rocephin for now -culture demonstrated microorganism to be pansensitive -will treat for a total of 10 days and at discharge will make transition to ceftin  3-DM: will use SSI; A1C 7.4 -will resume metformin at discharge.  4-HTN: BP is now rising. Patient will ask brother to bring medications to figure out home regimen. Meanwhile will start bidil  5-Hx of alcohol abuse: will continue thiamine and folic acid. No signs of withdrawal.   6-physical deconditioning: per PT/OT recommendations will benefit on SNF at discharge. Will follow clinical response and improvement.  7- chest discomfort: will continue PPI. -CE'z negative X3 -EKG and telemetry w/o acute ischemic changes -pain is reproducible with palpation -CXR w/o acute abnormality  -will continue tramadol for PRN analgesia -will add robaxin for better control of muscular discomfort.  DVT: lovenox  Code Status: Full Family Communication: no family at bedside Disposition Plan: SNF at discharge   Consultants:  PT  Procedures:  See below for x-ray reports  Antibiotics:  rocephin  HPI/Subjective: Patient mentation continue to remain stable and probably back to baseline, no fever; no SOB. Continue complaining of chest discomfort (mid chest/epigastric/left costal area)  Objective: Filed Vitals:   06/16/13 1411 06/16/13 1459 06/16/13 2200 06/17/13 0600  BP: 166/116 143/89 155/89 164/90  Pulse: 89 86 62 63  Temp: 98.5 F (36.9 C)  98.4 F (36.9 C) 97.8 F (36.6 C)  TempSrc: Oral   Oral Oral  Resp: 20  20 20   Height:      Weight:      SpO2: 95%  95% 94%    Intake/Output Summary (Last 24 hours) at 06/17/13 1416 Last data filed at 06/17/13 1022  Gross per 24 hour  Intake    576 ml  Output    125 ml  Net    451 ml   Filed Weights   06/13/13 1724  Weight: 74.8 kg (164 lb 14.5 oz)    Exam:   General:  Patient continue to be well oriented; no SOB, no fever. Tolerating dys 3 diet  Cardiovascular: S1 and S2, no rubs, no gallops  Respiratory: CTA  Abdomen: soft, ND, positive BS  Musculoskeletal: no edema, no cyanosis; left costal area with bruise from recent fall.  Data Reviewed: Basic Metabolic Panel:  Recent Labs Lab 06/13/13 1347 06/16/13 0545  NA 140 138  K 3.8 3.4*  CL 105 100  CO2  --  25  GLUCOSE 272* 164*  BUN 29* 18  CREATININE 1.20 0.81  CALCIUM  --  9.9   Liver Function Tests:  Recent Labs Lab 06/13/13 1539  AST 8  ALT 6  ALKPHOS 73  BILITOT 0.2*  PROT 6.6  ALBUMIN 3.4*    Recent Labs Lab 06/13/13 1732  AMMONIA 19   CBC:  Recent Labs Lab 06/13/13 1340 06/13/13 1347  WBC 8.2  --   NEUTROABS 6.1  --   HGB 13.0 13.6  HCT 38.8* 40.0  MCV 88.4  --   PLT 173  --    Studies: Dg Chest 2 View  06/16/2013   *RADIOLOGY REPORT*  Clinical Data: Chest wall pain, cough  CHEST - 2 VIEW  Comparison: 06/13/2013  Findings: Lungs are clear. No pleural effusion or pneumothorax.  The heart is normal in size.  Visualized osseous structures are within normal limits.  Moderate hiatal hernia.  IMPRESSION: No evidence of acute cardiopulmonary disease.   Original Report Authenticated By: Charline Bills, M.D.    Scheduled Meds: . cefTRIAXone (ROCEPHIN)  IV  1 g Intravenous Q24H  . enoxaparin (LOVENOX) injection  40 mg Subcutaneous Q24H  . folic acid  1 mg Intravenous Daily  . insulin aspart  0-9 Units Subcutaneous TID WC  . pantoprazole  40 mg Oral Q1200  . thiamine  100 mg Oral Daily   Or  . thiamine  100 mg Intravenous Daily    Continuous Infusions: . dextrose 5 % and 0.45% NaCl 50 mL/hr at 06/17/13 1610    Principal Problem:   Acute encephalopathy Active Problems:   Chest wall pain   Fall   Time spent: >30 minutes   Aniko Finnigan  [facility] Pager [phone]. If 7PM-7AM, please contact night-coverage at [email or web address], password [facility] 06/17/2013, 2:16 PM  LOS: 4 days

## 2013-06-18 LAB — BASIC METABOLIC PANEL
CO2: 25 mEq/L (ref 19–32)
Calcium: 10.4 mg/dL (ref 8.4–10.5)
Creatinine, Ser: 0.72 mg/dL (ref 0.50–1.35)
GFR calc non Af Amer: >90 mL/min (ref >90)
Sodium: 135 mEq/L (ref 135–145)

## 2013-06-18 LAB — GLUCOSE, CAPILLARY: Glucose-Capillary: 172 mg/dL — ABNORMAL HIGH (ref 70–99)

## 2013-06-18 MED ORDER — DICYCLOMINE HCL 10 MG PO CAPS: 10.0000 mg | ORAL_CAPSULE | Freq: Four times a day (QID) | ORAL | Status: DC | PRN

## 2013-06-18 MED ORDER — ZOLPIDEM TARTRATE 5 MG PO TABS
5.0000 mg | ORAL_TABLET | Freq: Every evening | ORAL | Status: DC | PRN
  Administered 2013-06-18: 5 mg via ORAL
  Filled 2013-06-18: qty 1

## 2013-06-18 MED ORDER — GLIPIZIDE 5 MG PO TABS: 5.0000 mg | ORAL_TABLET | Freq: Two times a day (BID) | ORAL | Status: DC

## 2013-06-18 MED ORDER — TRAMADOL HCL 50 MG PO TABS
50.0000 mg | ORAL_TABLET | Freq: Four times a day (QID) | ORAL | Status: AC | PRN
Start: 2013-06-18 — End: ?

## 2013-06-18 MED ORDER — LISINOPRIL 20 MG PO TABS: 20.0000 mg | ORAL_TABLET | Freq: Every day | ORAL | Status: DC

## 2013-06-18 MED ORDER — METHOCARBAMOL 500 MG PO TABS: 500.0000 mg | ORAL_TABLET | Freq: Three times a day (TID) | ORAL | Status: DC | PRN

## 2013-06-18 MED ORDER — HYDROXYZINE HCL 10 MG PO TABS: 20.0000 mg | ORAL_TABLET | Freq: Three times a day (TID) | ORAL | Status: DC | PRN

## 2013-06-18 MED ORDER — CEFUROXIME AXETIL 500 MG PO TABS: 500.0000 mg | ORAL_TABLET | Freq: Two times a day (BID) | ORAL | Status: AC

## 2013-06-18 MED ORDER — METFORMIN HCL 500 MG PO TABS: 1000.0000 mg | ORAL_TABLET | Freq: Two times a day (BID) | ORAL | Status: DC

## 2013-06-18 NOTE — H&P (Signed)
Gave report to Marisue Ivan, Charity fundraiser at [employer]. Left number if had additional questions.

## 2013-06-18 NOTE — Progress Notes (Signed)
Clinical Social Work  CSW faxed DC summary to [facility] who is agreeable to admit patient today. CSW prepared DC packet with FL2 and hard scripts included. CSW informed patient and RN of DC plans. Patient is happy to DC to a facility close to his home. CSW coordinated transportation via [place]. Request # [phone]. CSW is signing off.  [place], [place] [phone]

## 2013-06-18 NOTE — Progress Notes (Signed)
Physical Therapy Treatment Patient Details Name: [name] MRN: [id number] DOB: 1941/01/18 Today's Date: 06/18/2013 Time: 4098-1191 PT Time Calculation (min): 24 min  PT Assessment / Plan / Recommendation  PT Comments   Pt continuing to progress with mobility. +1 assist for ambulation with walker. Demonstrates decreased dynamic standing balance still. Feel ST rehab will be of most benefit to maximize independence and safety with mobility.   Follow Up Recommendations  SNF     Does the patient have the potential to tolerate intense rehabilitation     Barriers to Discharge        Equipment Recommendations  Rolling walker with 5" wheels    Recommendations for Other Services OT consult  Frequency Min 3X/week   Progress towards PT Goals Progress towards PT goals: Progressing toward goals  Plan Current plan remains appropriate    Precautions / Restrictions Precautions Precautions: Fall Precaution Comments: Pt much more steady on feet today. Restrictions Weight Bearing Restrictions: No   Pertinent Vitals/Pain L shoulder-front and back-5/10 with activit    Mobility  Bed Mobility Bed Mobility: Supine to Sit Supine to Sit: 4: Min assist Details for Bed Mobility Assistance: cues to avoid pain in L shoulder. Increased time. Assist for trunk to upright.  Transfers Transfers: Sit to Stand;Stand to Sit Sit to Stand: 5: Supervision;From bed;From elevated surface Stand to Sit: 5: Supervision;To chair/3-in-1 Details for Transfer Assistance: Cues for hand placement when using walker. Ambulation/Gait Ambulation/Gait Assistance: 4: Min assist Ambulation Distance (Feet): 175 Feet Assistive device: Rolling walker Ambulation/Gait Assistance Details: Still somewhat unsteady. LOB to R side intermittently. Assist to stabilize throughout ambulation.  Gait Pattern: Step-through pattern;Trunk flexed;Decreased stride length    Exercises     PT Diagnosis:    PT Problem List:   PT  Treatment Interventions:     PT Goals (current goals can now be found in the care plan section) Acute Rehab PT Goals Patient Stated Goal: to get back home and get my [facility] working.  Visit Information  Last PT Received On: 06/18/13 Assistance Needed: +1 History of Present Illness: Pt with acute encephalopathy from UTI.    Subjective Data  Patient Stated Goal: to get back home and get my [facility] working.   Cognition  Cognition Arousal/Alertness: Awake/alert Behavior During Therapy: WFL for tasks assessed/performed Overall Cognitive Status: Within Functional Limits for tasks assessed    Balance  Balance Balance Assessed: Yes Static Standing Balance Static Standing - Balance Support: Bilateral upper extremity supported Static Standing - Level of Assistance: 5: Stand by assistance Static Standing - Comment/# of Minutes: 4 Dynamic Standing Balance Dynamic Standing - Balance Support: Bilateral upper extremity supported;During functional activity Dynamic Standing - Level of Assistance: 4: Min assist  End of Session PT - End of Session Equipment Utilized During Treatment: Gait belt Activity Tolerance: Patient tolerated treatment well Patient left: in chair;with call bell/phone within reach   GP     Rebeca Alert, MPT Pager: [phone]

## 2013-06-18 NOTE — Progress Notes (Signed)
Clinical Social Work  CSW spoke with [facility] who extended a bed offer. Patient and brother agreeable to patient going to [facility] for ST SNF. [facility] qual[facility] any [facility] after he DC from SNF. CSW informed MD of patient's DC plans.  [place], [place] [phone]

## 2013-06-18 NOTE — Discharge Summary (Signed)
Physician Discharge Summary  [name] ZOX:[id number] DOB: 12-03-1940 DOA: 06/13/2013  PCP: No primary provider on file.  Admit date: 06/13/2013 Discharge date: 06/18/2013  Time spent: >30 minutes  Recommendations for Outpatient Follow-up:  1. BMET to follow electrolytes and renal function 2. Reassess BP and adjust medications as needed  Discharge Diagnoses:  Acute encephalopathy E.coli UTI Chest wall pain Fall GERD HTN DM HLD   Discharge Condition: Stable and improved. Will lbe discharge to SNF for physical rehab and conditioning.   Diet recommendation: heart healthy and low carbohydrates diet  Filed Weights   06/13/13 1724  Weight: 74.8 kg (164 lb 14.5 oz)    History of present illness:  72 y/o man who is sent to the hospital today for confusion. Unable to obtain any history from the patient given marked confusion and difficulty with word finding. No family present. It was reported to me by EDP that patient's neighbor called him today and he was noted to not be normal and was very confused. In ED workup done so far has been negative including CT Head, CXR, BMET, CBC. UA is pending. We have been asked to admit him for further evaluation and management.    Hospital Course:  1-Acute toxic encephalopathy: due to UTI. Improved and back to baseline at discharge. -TSH, B12, RPR and ammonia WNL  -CT head and MRI w/o acute intracranial abnormalities  -will continue UTI tx with ceftin to complete 10 days total -mentation and interaction back to baseline -patient is medically stable and will be discharge to SNF for physical rehab   2-E. Coli UTI: will continue rocephin for now  -culture demonstrated microorganism to be pansensitive -will treat for a total of 10 days and at discharge will make transition to ceftin   3-DM: will use SSI; A1C 7.4  -will resume metformin, actos and glipizide at discharge.   4-HTN: stable.  -will discharge on home regimen and reassess for  further medication adjustments as an outpatient -advise to follow low sodium diet  5-Hx of alcohol abuse: no withdrawal during admission. -will continue MV daily  6-physical deconditioning: per PT/OT recommendations will benefit on SNF at discharge.   7- chest discomfort: will continue PPI.  -CE'z negative X3  -EKG and telemetry w/o acute ischemic changes  -pain is reproducible with palpation  -CXR w/o acute abnormality  -will continue tramadol and robaxin for PRN analgesia   8-Itching and anxiety: will continue PRN atarax.  *Rest of medical problems remains stable and the plan is to continue current medication regimen.   Procedures: See below for x-ray reports  Consultations:  PT/OT  Discharge Exam: Filed Vitals:   06/17/13 1432 06/17/13 2241 06/18/13 0700 06/18/13 1421  BP: 174/90 135/77 146/75 129/89  Pulse: 66 64 70 93  Temp: 98.2 F (36.8 C) 98.6 F (37 C) 98.3 F (36.8 C) 97.9 F (36.6 C)  TempSrc: Oral Oral Oral Oral  Resp: 18 18 18 18   Height:      Weight:      SpO2: 94% 94% 93% 98%   General: Patient continue to be well oriented; no SOB, no fever. Tolerating dys 3 diet  Cardiovascular: S1 and S2, no rubs, no gallops  Respiratory: CTA  Abdomen: soft, ND, positive BS  Musculoskeletal: no edema, no cyanosis; left costal area with bruise from recent fall.  Discharge Instructions  Discharge Orders   Future Orders Complete By Expires     Discharge instructions  As directed     Comments:  Take medications as prescribed Follow up with PCP in 2 weeks BMET in 5 days to follow electrolytes and kidney function Please maintain patient well hydrated Follow a low sodium diet heart healthy diet PT/OT per SNF protocol for physical rehab and conditioning        Medication List    STOP taking these medications       hydrochlorothiazide 25 MG tablet  Commonly known as:  HYDRODIURIL      TAKE these medications       ascorbic acid 500 MG tablet   Commonly known as:  VITAMIN C  Take 1,000 mg by mouth daily.     aspirin EC 81 MG tablet  Take 81 mg by mouth daily.     atorvastatin 80 MG tablet  Commonly known as:  LIPITOR  Take 40 mg by mouth daily.     cefUROXime 500 MG tablet  Commonly known as:  CEFTIN  Take 1 tablet (500 mg total) by mouth 2 (two) times daily. Take medications for 6 more days (starting today) in order to finish antibiotic therapy.     Cholecalciferol 2000 UNITS Caps  Take 1 capsule by mouth every morning.     cyanocobalamin 500 MCG tablet  Take 1,000 mcg by mouth daily.     dicyclomine 10 MG capsule  Commonly known as:  BENTYL  Take 1 capsule (10 mg total) by mouth 4 (four) times daily as needed (abdominal pain).     ferrous sulfate 325 (65 FE) MG tablet  Take 325 mg by mouth daily with breakfast.     gabapentin 400 MG capsule  Commonly known as:  NEURONTIN  Take 800 mg by mouth 3 (three) times daily.     glipiZIDE 5 MG tablet  Commonly known as:  GLUCOTROL  Take 1 tablet (5 mg total) by mouth 2 (two) times daily before a meal.     hydrOXYzine 10 MG tablet  Commonly known as:  ATARAX/VISTARIL  Take 2 tablets (20 mg total) by mouth every 8 (eight) hours as needed for itching or anxiety.     lisinopril 20 MG tablet  Commonly known as:  PRINIVIL,ZESTRIL  Take 1 tablet (20 mg total) by mouth daily. Hold for SBP <100     Magnesium Oxide 420 MG Tabs  Take 2 tablets by mouth 2 (two) times daily.     metFORMIN 500 MG tablet  Commonly known as:  GLUCOPHAGE  Take 2 tablets (1,000 mg total) by mouth 2 (two) times daily with a meal.     methocarbamol 500 MG tablet  Commonly known as:  ROBAXIN  Take 1 tablet (500 mg total) by mouth every 8 (eight) hours as needed.     niacin 500 MG tablet  Commonly known as:  SLO-NIACIN  Take 500 mg by mouth at bedtime.     pantoprazole 40 MG tablet  Commonly known as:  PROTONIX  Take 40 mg by mouth daily.     pioglitazone 45 MG tablet  Commonly known as:   ACTOS  Take 45 mg by mouth daily.     propranolol ER 80 MG 24 hr capsule  Commonly known as:  INDERAL LA  Take 80 mg by mouth daily. To prevent headache.     traMADol 50 MG tablet  Commonly known as:  ULTRAM  Take 1 tablet (50 mg total) by mouth every 6 (six) hours as needed for pain.       Allergies  Allergen Reactions  . Penicillins Hives  and Rash    The results of significant diagnostics from this hospitalization (including imaging, microbiology, ancillary and laboratory) are listed below for reference.    Significant Diagnostic Studies: Dg Chest 2 View  06/16/2013   *RADIOLOGY REPORT*  Clinical Data: Chest wall pain, cough  CHEST - 2 VIEW  Comparison: 06/13/2013  Findings: Lungs are clear. No pleural effusion or pneumothorax.  The heart is normal in size.  Visualized osseous structures are within normal limits.  Moderate hiatal hernia.  IMPRESSION: No evidence of acute cardiopulmonary disease.   Original Report Authenticated By: Charline Bills, M.D.   Dg Chest 2 View  06/13/2013   *RADIOLOGY REPORT*  Clinical Data: Stroke.  Weakness and confusion  CHEST - 2 VIEW  Comparison: 06/13/2013  Findings: Heart size is normal.  No pleural effusion or edema. There is a tortuous and unfolded thoracic aorta noted.  No airspace consolidation identified.  Visualized osseous structures are grossly unremarkable.  IMPRESSION:  1.  No acute cardiopulmonary abnormalities.   Original Report Authenticated By: Signa Kell, M.D.   Ct Head Wo Contrast  06/13/2013   *RADIOLOGY REPORT*  Clinical Data: Altered mental status.  Disorientation.  Bruising. Diabetes.  Hypertension.  CT HEAD WITHOUT CONTRAST  Technique:  Contiguous axial images were obtained from the base of the skull through the vertex without contrast.  Comparison: 02/11/2011; 02/13/2011  Findings: The brain stem, cerebellum, cerebral peduncles, thalami, basal ganglia, basilar cisterns, and ventricular system appear unremarkable.  No  intracranial hemorrhage, mass lesion, or acute infarction is identified.  Mild chronic ischemic microvascular white matter disease noted.  IMPRESSION:  1.  No acute intracranial findings. 2.  Mild chronic ischemic microvascular white matter disease.   Original Report Authenticated By: Gaylyn Rong, M.D.   Mr Brain Wo Contrast  06/14/2013   *RADIOLOGY REPORT*  Clinical Data: Altered mental status.  MRI HEAD WITHOUT CONTRAST  Technique:  Multiplanar, multiecho pulse sequences of the brain and surrounding structures were obtained according to standard protocol without intravenous contrast.  Comparison: 06/13/2013 CT.  02/13/2011 MR.  Findings: The patient is claustrophobic.  Only diffusion sequence was able to be obtained.  This sequence is limited in the frontal region secondary to artifact.  Taking these limitations into account, no acute infarct is noted.  Global atrophy.  No hydrocephalus.  Small vessel disease type changes.  Limited for evaluating for mass or hemorrhage.  No gross abnormality noted.  IMPRESSION: The patient is claustrophobic.  Only diffusion sequence was able to be obtained.  This sequence is limited in the frontal region secondary to artifact.  Taking these limitations into account, no acute infarct is noted.  Global atrophy.  No hydrocephalus.  Small vessel disease type changes.  Limited for evaluating for mass or hemorrhage.  No gross abnormality noted.   Original Report Authenticated By: Lacy Duverney, M.D.   Dg Chest Port 1 View  06/13/2013   *RADIOLOGY REPORT*  Clinical Data: Chest pain.  Diabetic.  PORTABLE CHEST - 1 VIEW  Comparison: 02/13/2011.  Findings: Tortuous calcified aorta.  Central pulmonary vascular prominence.  No segmental infiltrate or gross pneumothorax.  Heart size top normal.  IMPRESSION: Tortuous calcified aorta. If primary aortic abnormality is of concern as a cause of patient's chest pain, CT imaging may then be considered.  Central pulmonary vascular  prominence.  No segmental infiltrate or gross pneumothorax.  Heart size top normal.   Original Report Authenticated By: Lacy Duverney, M.D.    Microbiology: Recent Results (from the past 240 hour(s))  URINE CULTURE     Status: None   Collection Time    06/14/13  7:56 AM      Result Value Range Status   Specimen Description URINE, RANDOM   Final   Special Requests NONE   Final   Culture  Setup Time 06/14/2013 13:59   Final   Colony Count >=100,000 COLONIES/ML   Final   Culture ESCHERICHIA COLI   Final   Report Status 06/16/2013 FINAL   Final   Organism ID, Bacteria ESCHERICHIA COLI   Final     Labs: Basic Metabolic Panel:  Recent Labs Lab 06/13/13 1347 06/16/13 0545 06/18/13 0510  NA 140 138 135  K 3.8 3.4* 3.9  CL 105 100 99  CO2  --  25 25  GLUCOSE 272* 164* 181*  BUN 29* 18 11  CREATININE 1.20 0.81 0.72  CALCIUM  --  9.9 10.4   Liver Function Tests:  Recent Labs Lab 06/13/13 1539  AST 8  ALT 6  ALKPHOS 73  BILITOT 0.2*  PROT 6.6  ALBUMIN 3.4*    Recent Labs Lab 06/13/13 1732  AMMONIA 19   CBC:  Recent Labs Lab 06/13/13 1340 06/13/13 1347  WBC 8.2  --   NEUTROABS 6.1  --   HGB 13.0 13.6  HCT 38.8* 40.0  MCV 88.4  --   PLT 173  --    Cardiac Enzymes:  Recent Labs Lab 06/15/13 1135 06/15/13 1601  TROPONINI <0.30 <0.30   CBG:  Recent Labs Lab 06/17/13 1159 06/17/13 1633 06/17/13 2015 06/18/13 0021 06/18/13 0549  GLUCAP 185* 190* 204* 172* 193*   Signed:  Dayleen Beske  [facility] 06/18/2013, 2:42 PM

## 2013-06-18 NOTE — Progress Notes (Signed)
Occupational Therapy Treatment Patient Details Name: [name] MRN: [id number] DOB: Feb 16, 1941 Today's Date: 06/18/2013 Time: 9147-8295 OT Time Calculation (min): 40 min  OT Assessment / Plan / Recommendation  OT comments  Pt with significant improvements in cognitive status and adls.  Pt at S to [place] assist level with all adls.  Follow Up Recommendations  SNF    Barriers to Discharge       Equipment Recommendations  None recommended by OT    Recommendations for Other Services    Frequency Min 2X/week   Progress towards OT Goals Progress towards OT goals: Goals met and updated - see care plan  Plan Discharge plan remains appropriate    Precautions / Restrictions Precautions Precautions: Fall Precaution Comments: Pt much more steady on feet today. Restrictions Weight Bearing Restrictions: No   Pertinent Vitals/Pain Pt c/o 5/10 pain in L anterior shoulder area.    ADL  Eating/Feeding: Performed;Independent Where Assessed - Eating/Feeding: Chair Grooming: Performed;Wash/dry hands;Wash/dry face;Teeth care;Supervision/safety Where Assessed - Grooming: Supported standing Upper Body Dressing: Performed;Set up Where Assessed - Upper Body Dressing: Unsupported sitting Lower Body Dressing: Performed;Supervision/safety Where Assessed - Lower Body Dressing: Supported sit to stand Toilet Transfer: Performed;Min guard Statistician Method: Other (comment) (walked to bathroom) Acupuncturist: Comfort height toilet;Grab bars Toileting - Clothing Manipulation and Hygiene: Performed;Supervision/safety Where Assessed - Engineer, mining and Hygiene: Standing Equipment Used: Rolling walker Transfers/Ambulation Related to ADLs: Pt walked in room with walker and requires S to occasional min guard for decreased balance.  Much improved since admit. ADL Comments: Pt overall S with all adls.  S required when pt is on his feet due to decreased balance.    OT  Diagnosis:    OT Problem List:   OT Treatment Interventions:     OT Goals(current goals can now be found in the care plan section) Acute Rehab OT Goals Patient Stated Goal: to get back home and get my [facility] working. OT Goal Formulation: With patient Time For Goal Achievement: 06/28/13 Potential to Achieve Goals: Good ADL Goals Pt Will Perform Grooming: with modified independence;standing Pt Will Perform Tub/Shower Transfer: Shower transfer;with modified independence;ambulating Additional ADL Goal #1: pt will follow 25% of commands in context with multimodal cues and assistance to initiate Additional ADL Goal #2: Pt will roll to bil sides for adls with mod A, multimodal cues  Visit Information  Last OT Received On: 06/18/13 Assistance Needed: +1 History of Present Illness: Pt with acute encephalopathy from UTI.    Subjective Data      Prior Functioning       Cognition  Cognition Arousal/Alertness: Awake/alert Behavior During Therapy: WFL for tasks assessed/performed Overall Cognitive Status: Within Functional Limits for tasks assessed    Mobility  Bed Mobility Bed Mobility: Supine to Sit Supine to Sit: 6: Modified independent (Device/Increase time) Details for Bed Mobility Assistance: cues to avoid pain in L shoulder. Transfers Transfers: Sit to Stand;Stand to Sit Sit to Stand: 5: Supervision;From chair/3-in-1 Stand to Sit: 5: Supervision;To bed Details for Transfer Assistance: Cues for hand placement when using walker.    Exercises      Balance Balance Balance Assessed: Yes Static Standing Balance Static Standing - Balance Support: Bilateral upper extremity supported Static Standing - Level of Assistance: 5: Stand by assistance Static Standing - Comment/# of Minutes: 4   End of Session OT - End of Session Activity Tolerance: Patient tolerated treatment well Patient left: in bed;with bed alarm set Nurse Communication: Mobility status  GO  Hope Budds 06/18/2013, 11:47 AM [phone]

## 2013-06-19 ENCOUNTER — Non-Acute Institutional Stay (SKILLED_NURSING_FACILITY): Payer: Medicare Other | Admitting: Internal Medicine

## 2013-06-19 DIAGNOSIS — E119 Type 2 diabetes mellitus without complications: Secondary | ICD-10-CM

## 2013-06-19 DIAGNOSIS — G609 Hereditary and idiopathic neuropathy, unspecified: Secondary | ICD-10-CM

## 2013-06-19 DIAGNOSIS — I10 Essential (primary) hypertension: Secondary | ICD-10-CM

## 2013-06-19 DIAGNOSIS — N390 Urinary tract infection, site not specified: Secondary | ICD-10-CM

## 2013-06-20 LAB — GLUCOSE, CAPILLARY: Glucose-Capillary: 232 mg/dL — ABNORMAL HIGH (ref 70–99)

## 2013-06-25 ENCOUNTER — Non-Acute Institutional Stay (SKILLED_NURSING_FACILITY): Payer: Medicare Other | Admitting: Internal Medicine

## 2013-06-25 DIAGNOSIS — D649 Anemia, unspecified: Secondary | ICD-10-CM

## 2013-06-25 DIAGNOSIS — N390 Urinary tract infection, site not specified: Secondary | ICD-10-CM

## 2013-06-25 DIAGNOSIS — E119 Type 2 diabetes mellitus without complications: Secondary | ICD-10-CM

## 2013-06-25 DIAGNOSIS — G609 Hereditary and idiopathic neuropathy, unspecified: Secondary | ICD-10-CM | POA: Insufficient documentation

## 2013-06-25 DIAGNOSIS — R0789 Other chest pain: Secondary | ICD-10-CM

## 2013-06-25 DIAGNOSIS — G9340 Encephalopathy, unspecified: Secondary | ICD-10-CM

## 2013-06-25 DIAGNOSIS — K219 Gastro-esophageal reflux disease without esophagitis: Secondary | ICD-10-CM

## 2013-06-25 DIAGNOSIS — I10 Essential (primary) hypertension: Secondary | ICD-10-CM

## 2013-06-25 DIAGNOSIS — R071 Chest pain on breathing: Secondary | ICD-10-CM

## 2013-06-25 HISTORY — DX: Gastro-esophageal reflux disease without esophagitis: K21.9

## 2013-06-25 NOTE — Progress Notes (Signed)
Patient ID: [name], male   DOB: 1941-08-20, 72 y.o.   MRN: [id number]  This is a discharge note.  Facility -[facility]  Level of care skilled.  Chief complaint-discharge no.  History of present is.  Patient is a pleasant 72 year old male who was hospitalized for altered mental status.  Apparently the neighbor called on the day of admission the patient was noted to be confused.  Hospital workup did reveal a UTI and apparently encephalopathy fairly rapidly resolved with treatment  He initially was treated both Rochepin and has now completed a course of Ceftin.  Apparently at one point he did complain of chest pain during his hospitalization.  Cardiac enzymes were negative EKG and telemetry did not show any acute changes-chest x-ray was negative for an acute process.  He is on a proton pump inhibitor and apparently there has been no recurrence of this-he is on Ultram for pain also has Robaxin but does not really take it .  He is also a type II diabetic on Actos as well as Glucophage blood sugars have been satisfactory during his stay here--ranging largely from the 80s to mid 100s in the morning-at noon more mid 100s to low 200s-at 4 PM back more into the low 100s.  And averaging it appears roughly in the mid 100s at at bedtime  Nursing staff reports patient is doing very well-apparently he's gained significant strength with therapy---he will be going back home.--Her he was quite independent before    family medical social history reviewed per discharge summary on 06/18/2013 His medical history includes.  Acute encephalopathy resolved.  UTI resolved.  Chest wall pain  GERD.  Hypertension.  Diabetes type 2.  Hyperlipidemia.  History of alcohol abuse  Neuropathy.  Medications have been reviewed per MAR.  Review of systems.  General denies any fever or chills.  Respiratory does not complain of cough or shortness of breath.  Cardiac-does not complaining of  chest pain says occasionally he will have pain in the chest when he coughs or makes a movement apparently this was the case in the hospital as well and thought to be noncardiac per workup.  GI-does not complaining of any nausea vomiting diarrhea or constipation this has been stable per nursing staff does not complaining of abdominal pain.  GU-does not complain of dysuria has completed antibiotic for UTI.  Muscle skeletal-has gained strength appears does not complain of any joint pain other than some chest discomfort when he makes sudden movements or cough.  Neurologic-does not complaining of any headache dizziness syncopal-type feelings does apparently have a headache history and has propanolol routinely.  Psych-does not complaining of being depressed or anxious.  Physical exam.  Temperature is 98.9 pulse 86 respirations 18 blood pressure 133/64 weight is 161.4.  In general this is a fairly well-nourished elderly male who looks somewhat younger than his stated age.  His skin is warm and dry-appears to have a graft like area on the back of his neck.  Eyes-visual acuity appears intact he does have prescription lenses sclera and conjunctiva are clear.  Oropharynx clear mucous membranes moist.  Chest is clear to auscultation without rhonchi rales or wheezes no labored breathing.  Heart is regular rate and rhythm with somewhat distant heart sounds-he does not have significant lower extremity edema.  Abdomen is soft does not appear to be acutely tender there are positive bowel sounds.  Muscle skeletal-does move all extremities x4 appears with appropriate strength and range of motion I did not note  any deformities.  Neurologic-is grossly intact his speech is clear.  Psych he is alert and oriented x3 pleasant and appropriate.  Labs.  06/18/2013.  Sodium 135 potassium 3.9 BUN 11 creatinine 0.7 to.  I 16 2014.  Liver function tests within normal limits except total bilirubin  0.2-albumin of 3.4.  06/13/2013.  Hemoglobin 13.6.  WBC 8.2 platelets 173.   Assessment and plan.  #1  encephalopathy-this appears to be resolved status post treatment for UTI  Workup in hospital was unremarkable with normal TSH B12 RPR and ammonia level-CT of the head and MRI showed no acute intracranial abnormalities.  #2-diabetes type 2 this appears to be stable hemoglobin A1c hospital was 7.4 blood sugars here have not been real remarkable will defer to primary care provider for followup he is on Glucophage Actos as well as glipizide.  #3-hypertension this appears stable he is on lisinopril-also on propanolol although this apparently is for headaches as well.--Update BMP unless drawn earlier in the facility if so notify provideroff results  #4-history of neuropathy-he continues on Neurontin this appears to be stable.  #5-history anemia-he is on iron will update CBC before discharge. If not drawn recently in facility  #6-history of chest wall discomfort-question musculoskeletal or GERD-he is on a proton pump inhibitor also has Ultram for pain-also has Robaxin prescribed but does not really take this.  #7 as hyperlipidemia he does continue on a statin as well as niacin will defer followup to primary care provider.   #8-apparent history of alcohol abuse-patient hasn't shown no withdrawal symptoms-continues on vitamin supplementation  Of note Will obtain CBC basic metabolic panel and magnesium level if not drawn previously in the facility-clinically he does appear stable and will be going home -- he lives alone-always been apparently pretty independent   He will need PT OT home health care-nursing upon discharge.  CPT code 99316-of note greater than 30 minutes spent preparing this discharge summary

## 2013-07-19 DIAGNOSIS — N390 Urinary tract infection, site not specified: Secondary | ICD-10-CM | POA: Insufficient documentation

## 2013-07-19 NOTE — Progress Notes (Signed)
Patient ID: [name], male   DOB: 01-May-1941, 72 y.o.   MRN: [id number]        HISTORY & PHYSICAL  DATE: 06/19/2013   FACILITY: [facility]  LEVEL OF CARE: SNF (31)  ALLERGIES:  Allergies  Allergen Reactions  . Penicillins Hives and Rash    CHIEF COMPLAINT:  Manage UTI, diabetes mellitus, and hypertension.    HISTORY OF PRESENT ILLNESS:  The patient is a 72 year-old, Caucasian male who was hospitalized for encephalopathy.  After hospitalization, he is admitted to this facility for short-term rehabilitation.      UTI: His encephalopathy was felt to be secondary to a UTI.  The UTI remains stable.  The patient denies ongoing suprapubic pain, flank pain, dysuria, urinary frequency, urinary hesitancy or hematuria.  No complications reported from the current antibiotic being used.  He is discharged on Ceftin.    DM:pt's DM remains stable.  Pt denies polyuria, polydipsia, polyphagia, changes in vision or hypoglycemic episodes.  No complications noted from the medication presently being used.  Last hemoglobin A1c is:  7.4.    HTN: Pt 's HTN remains stable.  Denies CP, sob, DOE, pedal edema, headaches, dizziness or visual disturbances.  No complications from the medications currently being used.  Last BP :  143/84.  PAST MEDICAL HISTORY :  Past Medical History  Diagnosis Date  . Diabetes mellitus   . Hypertension     PAST SURGICAL HISTORY: None  SOCIAL HISTORY:  reports that he has been smoking Cigarettes.  He has a 1 pack-year smoking history. He does not have any smokeless tobacco history on file. He reports that  drinks alcohol. He reports that he does not use illicit drugs.  FAMILY HISTORY: None  CURRENT MEDICATIONS: Reviewed per MAR  REVIEW OF SYSTEMS:  See HPI otherwise 14 point ROS is negative.  PHYSICAL EXAMINATION   VS:  T 98.5       P 70      RR 22      BP 143/84      POX%        WT (Lb)  GENERAL: no acute distress, normal body  habitus SKIN: warm & dry, no suspicious lesions or rashes, no excessive dryness EYES: conjunctivae normal, sclerae normal, normal eye lids MOUTH/THROAT: lips without lesions,no lesions in the mouth,tongue is without lesions,uvula elevates in midline NECK: supple, trachea midline, no neck masses, no thyroid tenderness, no thyromegaly LYMPHATICS: no LAN in the neck, no supraclavicular LAN RESPIRATORY: breathing is even & unlabored, BS CTAB CARDIAC: RRR, no murmur,no extra heart sounds, no edema GI:  ABDOMEN: abdomen soft, normal BS, no masses, no tenderness  LIVER/SPLEEN: no hepatomegaly, no splenomegaly MUSCULOSKELETAL: HEAD: normal to inspection & palpation BACK: no kyphosis, scoliosis or spinal processes tenderness EXTREMITIES: LEFT UPPER EXTREMITY: strength intact, range of motion unable to assess    RIGHT UPPER EXTREMITY:  full range of motion, normal strength & tone, right axilla tender to palpation   LEFT LOWER EXTREMITY: strength intact, range of motion moderate  RIGHT LOWER EXTREMITY: strength intact, range of motion moderate  PSYCHIATRIC: the patient is alert & oriented to person, affect & behavior appropriate  LABS/RADIOLOGY: CT of the head:  No acute findings.    Chest x-ray:  No acute findings.    MRI of the brain:  No acute findings.    Urine culture showed no growth.    Glucose 181, otherwise BMP normal.    Albumin 3.4, otherwise liver profile  normal.    Ammonia level 19.    CBC normal.    Cardiac enzymes negative x3.    EKG without any acute ischemic findings.    TSH, B12, and RPR normal.    ASSESSMENT/PLAN:  UTI.  Continue Ceftin.    Diabetes mellitus.  Continue current medications.  Monitor CBGs.    Hypertension.  Blood pressure borderline.  We will monitor.    Peripheral neuropathy.  Continue Neurontin.    Anemia of chronic disease.  Continue iron.  Reassess hemoglobin level.    Check CBC and BMP.    I have reviewed patient's medical records  received at admission/from hospitalization.  CPT CODE: 81191

## 2014-10-05 IMAGING — CR DG CHEST 2V
4 series · 4 of 4 positions shown · non-contrast
Comparison: 06/13/2013

CLINICAL DATA: Stroke.  Weakness and confusion

CHEST - 2 VIEW

[w chest lat (1 of 3)]
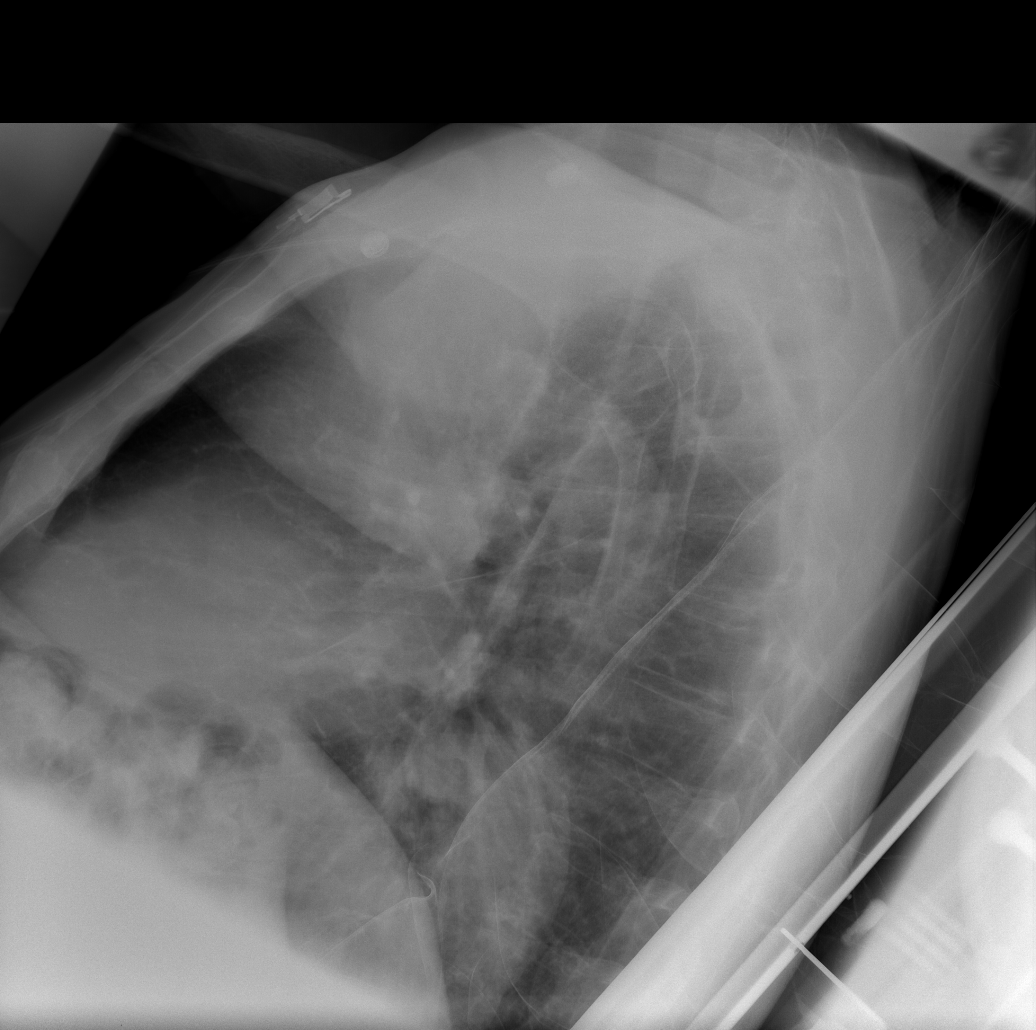

[w chest lat (2 of 3)]
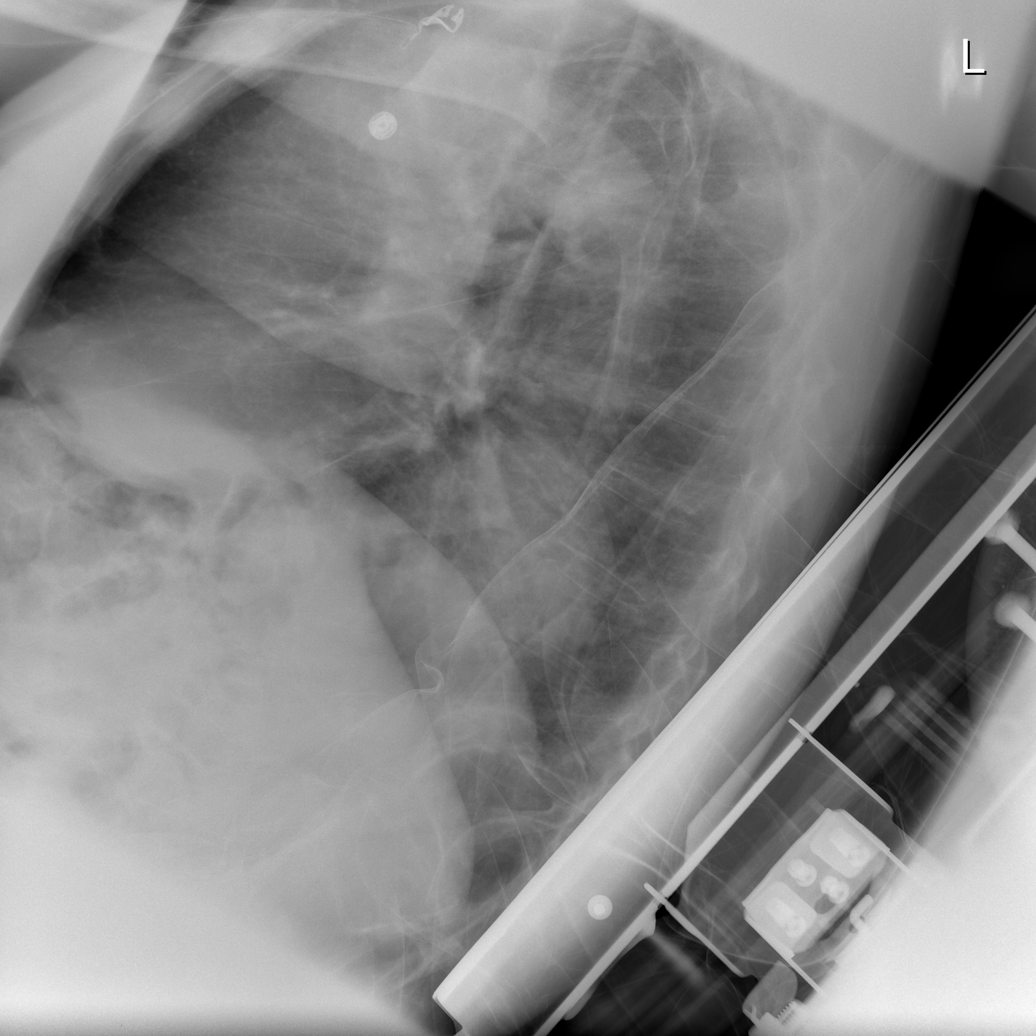

[w chest lat (3 of 3)]
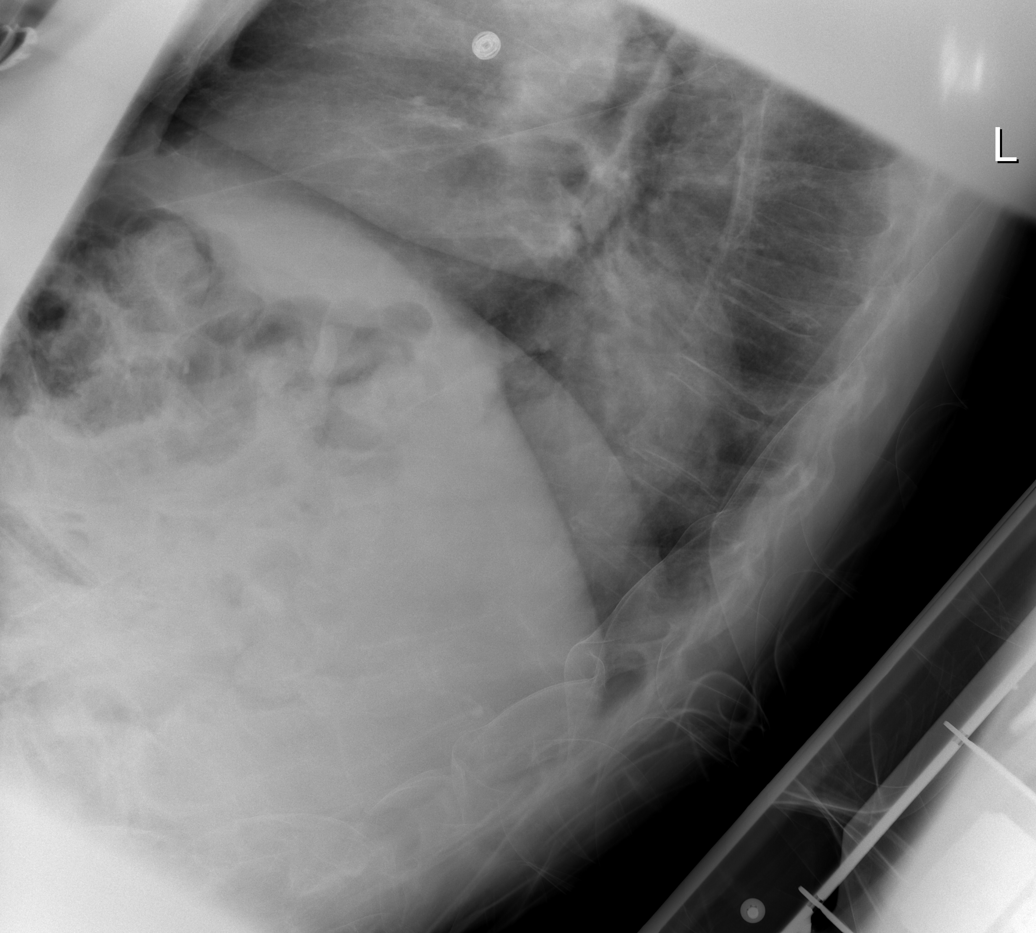

[view not recorded]
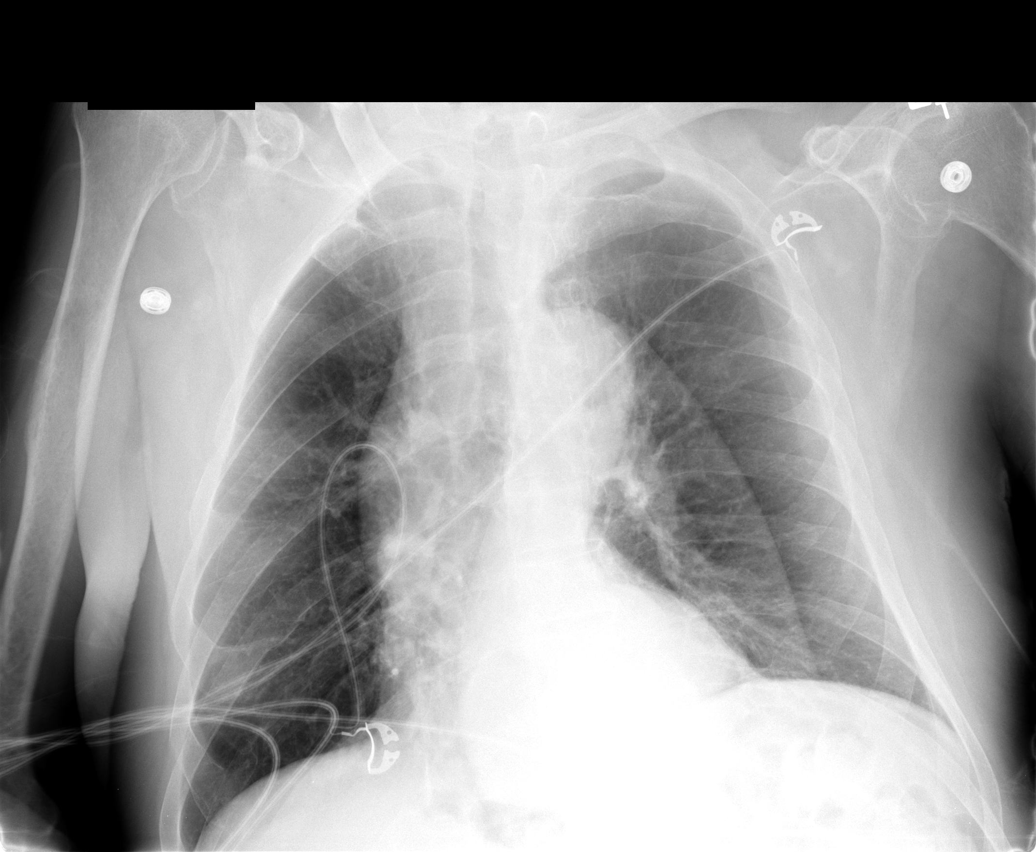

[4 of 4 positions shown; findings below may reference images not displayed]

FINDINGS: Heart size is normal.  No pleural effusion or edema.
There is a tortuous and unfolded thoracic aorta noted.  No airspace
consolidation identified.  Visualized osseous structures are
grossly unremarkable.
IMPRESSION: 1.  No acute cardiopulmonary abnormalities.

## 2014-10-05 IMAGING — CT CT HEAD W/O CM
4 series · 18 of 30 positions shown, 20 images · non-contrast
Comparison: 02/11/2011; 02/13/2011

CLINICAL DATA: Altered mental status.  Disorientation.  Bruising.
Diabetes.  Hypertension.

CT HEAD WITHOUT CONTRAST
TECHNIQUE: Contiguous axial images were obtained from the base of
the skull through the vertex without contrast.

[Series 2: head w/o · axial · non-contrast · 0.43mm/px · z∈[-129,-19]mm · 6 of 32 slices shown, 8 images (1 of 2)]
[im 5/32  brain]
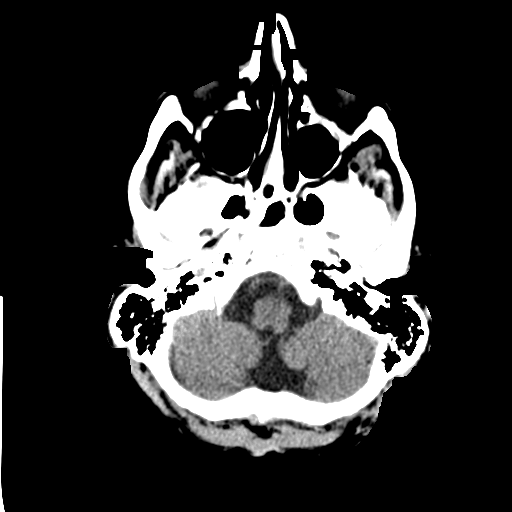
[im 5/32  bone]
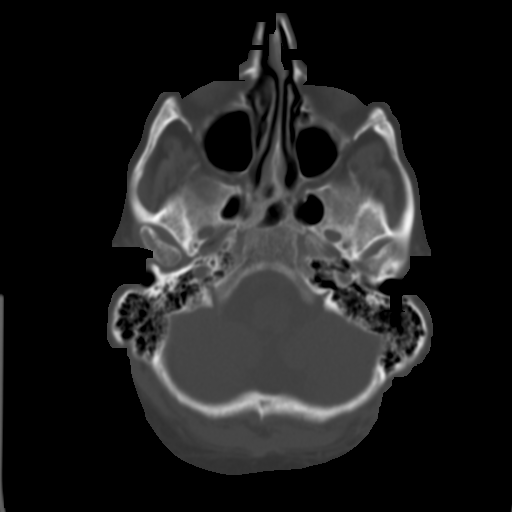
[im 9/32  brain]
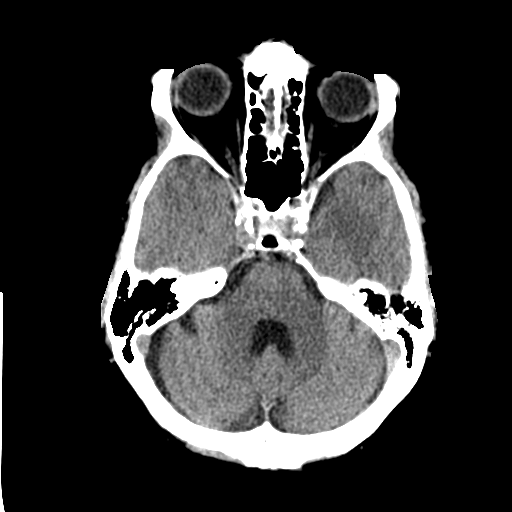
[im 14/32  brain]
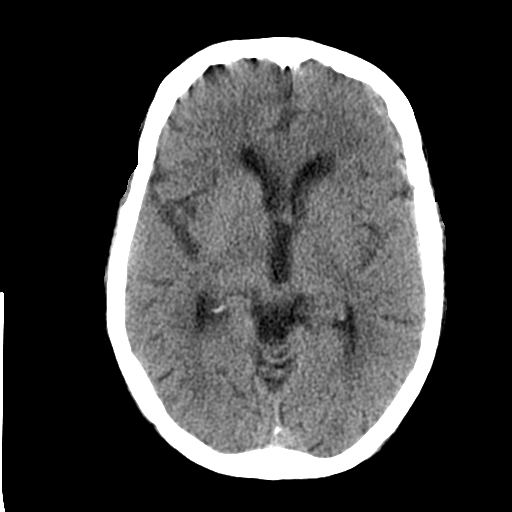
[im 18/32  brain]
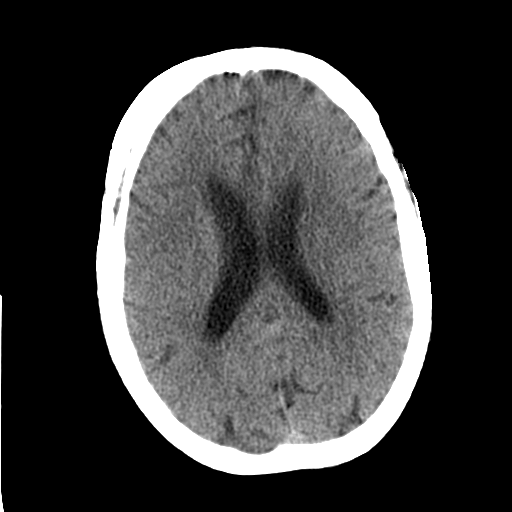
[im 23/32  brain]
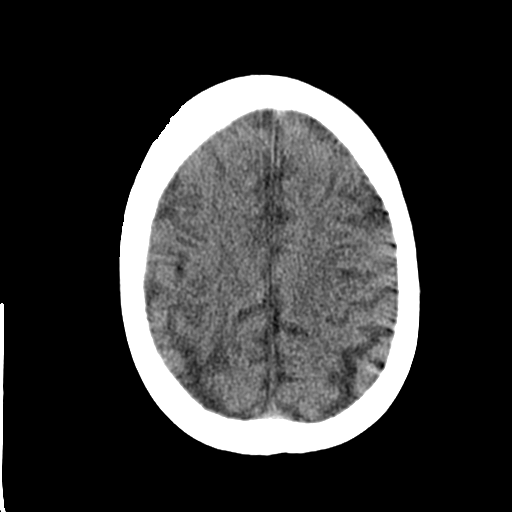
[im 23/32  bone]
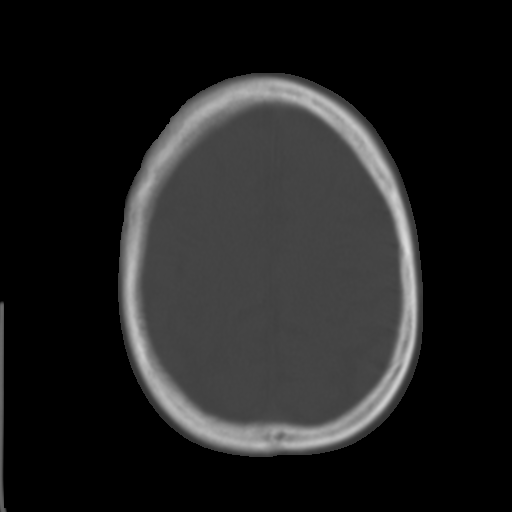
[im 27/32  brain]
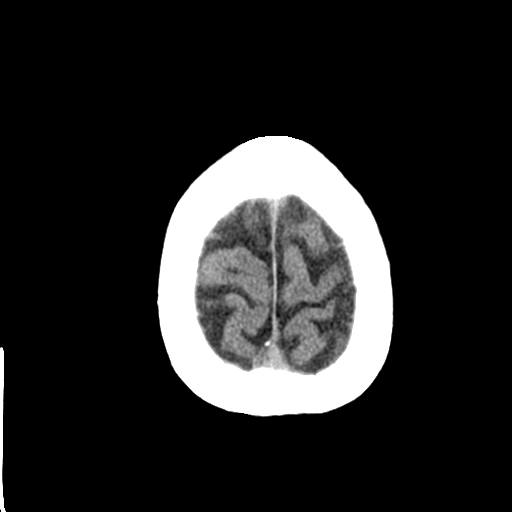

[Series 3: head w/o · axial · non-contrast · 0.43mm/px · z∈[-65,-30]mm · 2 of 21 slices shown (2 of 2)]
[im 7/21  brain]
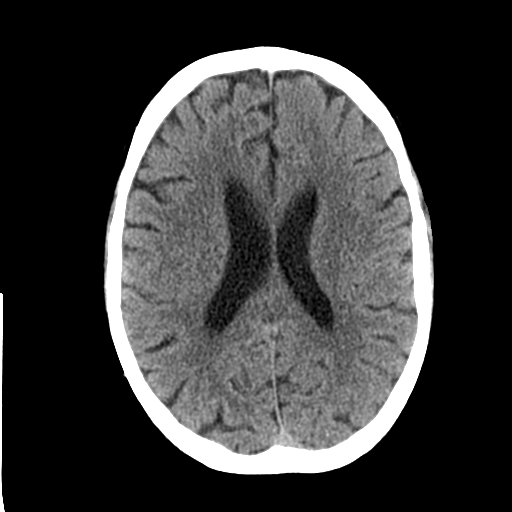
[im 14/21  brain]
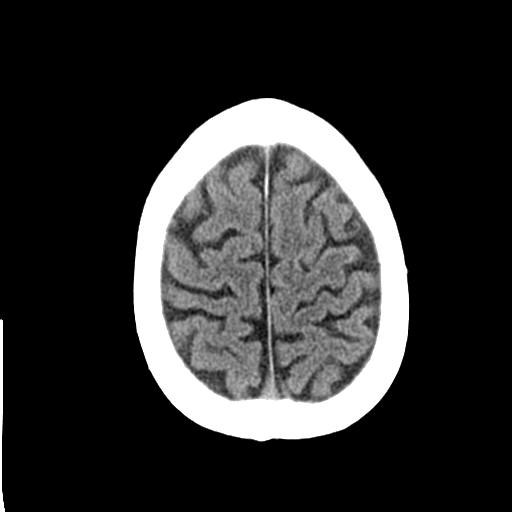

[Series 4: bone windows · axial · 0.43mm/px · z∈[-134,-11]mm · 8 of 53 slices shown (1 of 2)]
[im 6/53  bone]
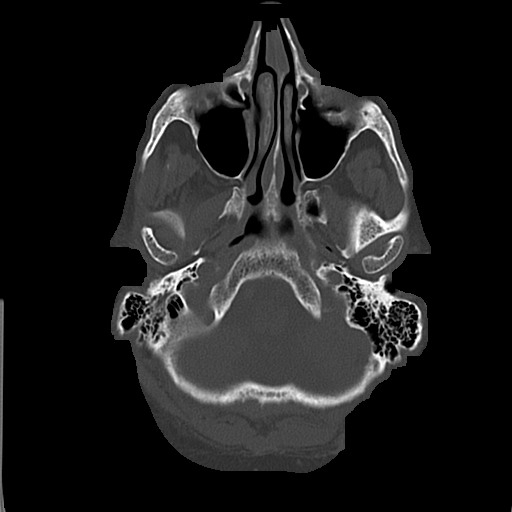
[im 11/53  bone]
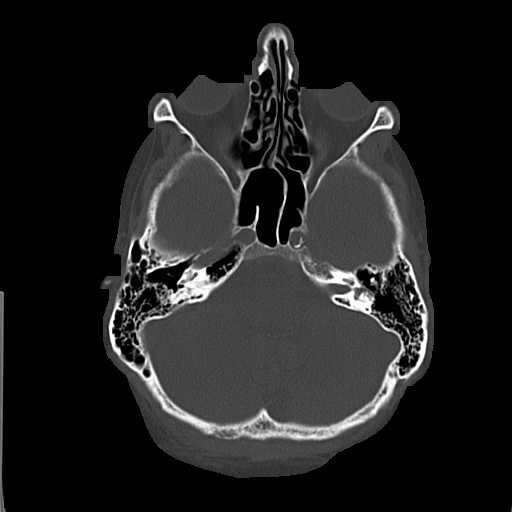
[im 16/53  bone]
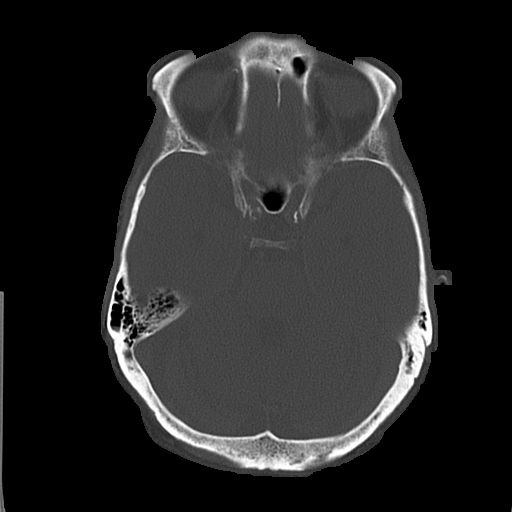
[im 21/53  bone]
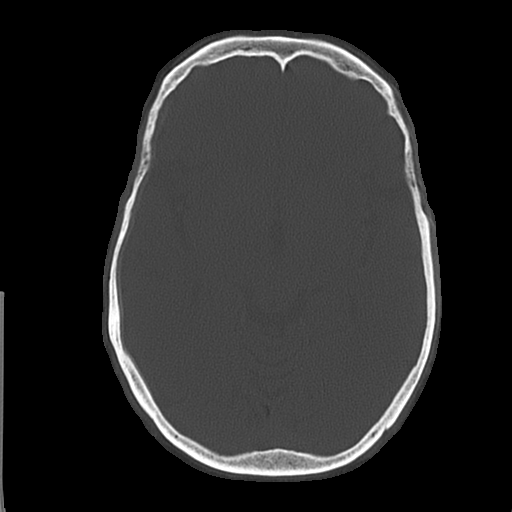
[im 32/53  bone]
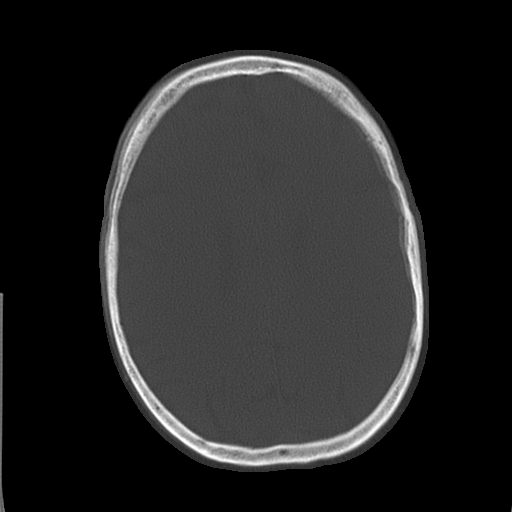
[im 37/53  bone]
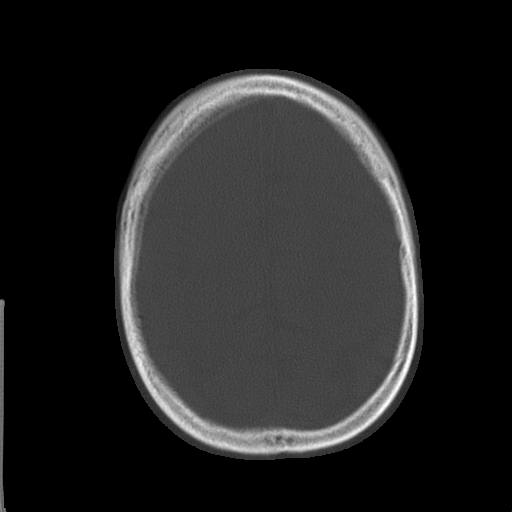
[im 42/53  bone]
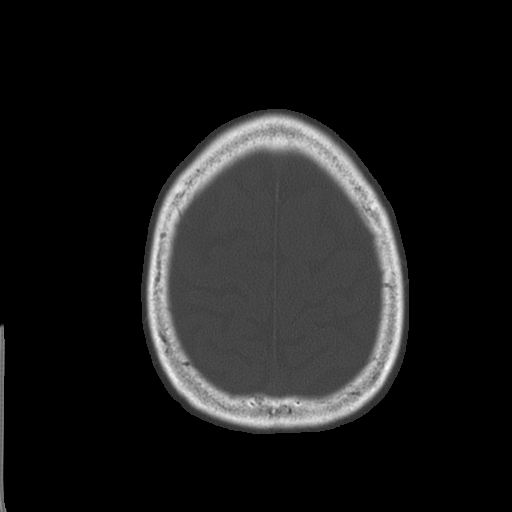
[im 47/53  bone]
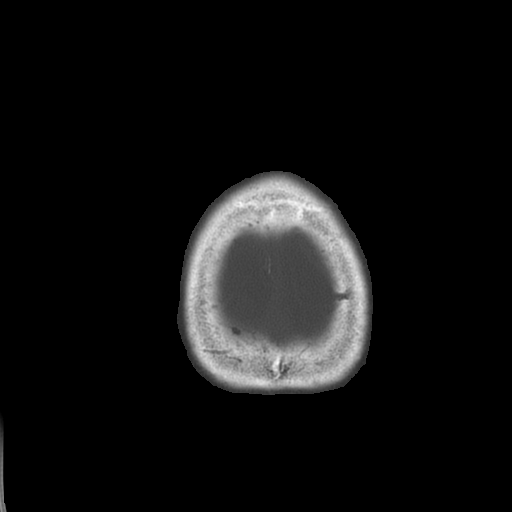

[Series 5: bone windows · axial · 0.43mm/px · z∈[-80,-62]mm · 2 of 35 slices shown (2 of 2)]
[im 6/35  bone]
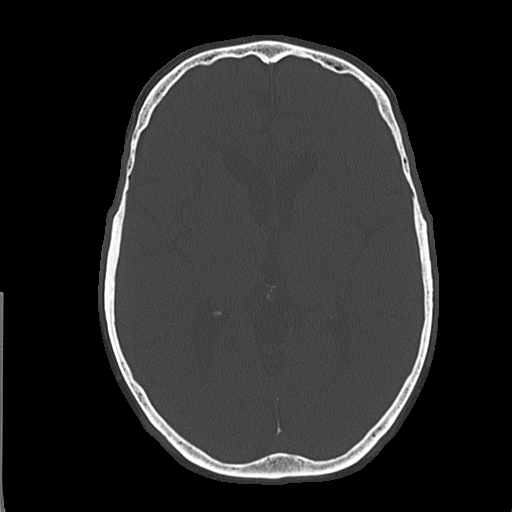
[im 12/35  bone]
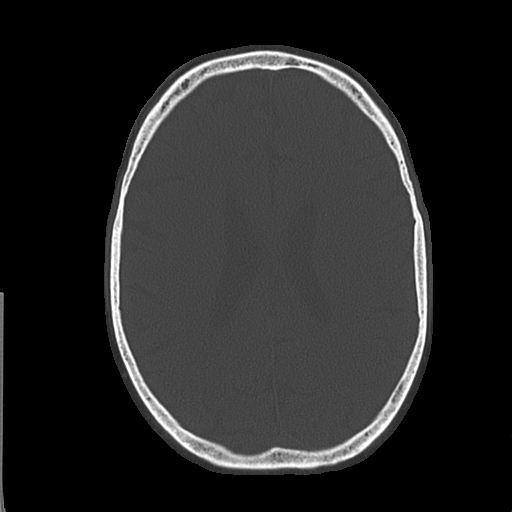

[18 of 30 positions shown; findings below may reference images not displayed]

FINDINGS: The brain stem, cerebellum, cerebral peduncles, thalami,
basal ganglia, basilar cisterns, and ventricular system appear
unremarkable.

No intracranial hemorrhage, mass lesion, or acute infarction is
identified.

Mild chronic ischemic microvascular white matter disease noted.
IMPRESSION: 1.  No acute intracranial findings.
2.  Mild chronic ischemic microvascular white matter disease.

## 2015-06-01 ENCOUNTER — Encounter (HOSPITAL_COMMUNITY): Payer: Self-pay

## 2015-06-01 ENCOUNTER — Encounter (HOSPITAL_COMMUNITY)
Admission: EM | Disposition: A | Discharge status: Home / Self Care | Payer: Non-veteran care | Source: Home / Self Care | Attending: Cardiology

## 2015-06-01 ENCOUNTER — Emergency Department (HOSPITAL_COMMUNITY): Payer: Medicare Other

## 2015-06-01 ENCOUNTER — Inpatient Hospital Stay (HOSPITAL_COMMUNITY)
Admission: EM | Admit: 2015-06-01 | Discharge: 2015-06-04 | DRG: 247 | Disposition: A | Discharge status: Home / Self Care | Payer: Medicare Other | Attending: Cardiology | Admitting: Cardiology

## 2015-06-01 DIAGNOSIS — I2510 Atherosclerotic heart disease of native coronary artery without angina pectoris: Secondary | ICD-10-CM | POA: Diagnosis present

## 2015-06-01 DIAGNOSIS — I2119 ST elevation (STEMI) myocardial infarction involving other coronary artery of inferior wall: Secondary | ICD-10-CM

## 2015-06-01 DIAGNOSIS — Z8673 Personal history of transient ischemic attack (TIA), and cerebral infarction without residual deficits: Secondary | ICD-10-CM | POA: Diagnosis not present

## 2015-06-01 DIAGNOSIS — I2111 ST elevation (STEMI) myocardial infarction involving right coronary artery: Secondary | ICD-10-CM | POA: Diagnosis present

## 2015-06-01 DIAGNOSIS — Z85820 Personal history of malignant melanoma of skin: Secondary | ICD-10-CM | POA: Insufficient documentation

## 2015-06-01 DIAGNOSIS — Z794 Long term (current) use of insulin: Secondary | ICD-10-CM | POA: Diagnosis not present

## 2015-06-01 DIAGNOSIS — I472 Ventricular tachycardia: Secondary | ICD-10-CM | POA: Diagnosis not present

## 2015-06-01 DIAGNOSIS — I700 Atherosclerosis of aorta: Secondary | ICD-10-CM

## 2015-06-01 DIAGNOSIS — R079 Chest pain, unspecified: Secondary | ICD-10-CM | POA: Diagnosis present

## 2015-06-01 DIAGNOSIS — Z955 Presence of coronary angioplasty implant and graft: Secondary | ICD-10-CM

## 2015-06-01 DIAGNOSIS — Z7902 Long term (current) use of antithrombotics/antiplatelets: Secondary | ICD-10-CM | POA: Diagnosis not present

## 2015-06-01 DIAGNOSIS — I213 ST elevation (STEMI) myocardial infarction of unspecified site: Secondary | ICD-10-CM

## 2015-06-01 DIAGNOSIS — I779 Disorder of arteries and arterioles, unspecified: Secondary | ICD-10-CM | POA: Insufficient documentation

## 2015-06-01 DIAGNOSIS — I10 Essential (primary) hypertension: Secondary | ICD-10-CM | POA: Diagnosis present

## 2015-06-01 DIAGNOSIS — I2584 Coronary atherosclerosis due to calcified coronary lesion: Secondary | ICD-10-CM | POA: Diagnosis present

## 2015-06-01 DIAGNOSIS — F17210 Nicotine dependence, cigarettes, uncomplicated: Secondary | ICD-10-CM | POA: Diagnosis present

## 2015-06-01 DIAGNOSIS — Z880 Allergy status to penicillin: Secondary | ICD-10-CM

## 2015-06-01 DIAGNOSIS — E1142 Type 2 diabetes mellitus with diabetic polyneuropathy: Secondary | ICD-10-CM | POA: Diagnosis present

## 2015-06-01 DIAGNOSIS — Z7982 Long term (current) use of aspirin: Secondary | ICD-10-CM | POA: Diagnosis not present

## 2015-06-01 DIAGNOSIS — I739 Peripheral vascular disease, unspecified: Secondary | ICD-10-CM

## 2015-06-01 DIAGNOSIS — Z79899 Other long term (current) drug therapy: Secondary | ICD-10-CM | POA: Diagnosis not present

## 2015-06-01 DIAGNOSIS — E785 Hyperlipidemia, unspecified: Secondary | ICD-10-CM | POA: Diagnosis present

## 2015-06-01 DIAGNOSIS — E1140 Type 2 diabetes mellitus with diabetic neuropathy, unspecified: Secondary | ICD-10-CM | POA: Diagnosis not present

## 2015-06-01 HISTORY — DX: Gastro-esophageal reflux disease without esophagitis: K21.9

## 2015-06-01 HISTORY — PX: CARDIAC CATHETERIZATION: SHX172

## 2015-06-01 HISTORY — DX: Atherosclerosis of aorta: I70.0

## 2015-06-01 HISTORY — DX: Disorder of arteries and arterioles, unspecified: I77.9

## 2015-06-01 HISTORY — DX: Peripheral vascular disease, unspecified: I73.9

## 2015-06-01 HISTORY — DX: Type 2 diabetes mellitus with diabetic polyneuropathy: E11.42

## 2015-06-01 HISTORY — DX: ST elevation (STEMI) myocardial infarction involving other coronary artery of inferior wall: I21.19

## 2015-06-01 HISTORY — DX: Personal history of malignant melanoma of skin: Z85.820

## 2015-06-01 HISTORY — DX: Malignant melanoma of skin, unspecified: C43.9

## 2015-06-01 LAB — COMPREHENSIVE METABOLIC PANEL
ALBUMIN: 4.1 g/dL (ref 3.5–5.0)
ALK PHOS: 99 U/L (ref 38–126)
ALT: 15 U/L — ABNORMAL LOW (ref 17–63)
AST: 39 U/L (ref 15–41)
Anion gap: 11 (ref 5–15)
BUN: 14 mg/dL (ref 6–20)
CALCIUM: 10.2 mg/dL (ref 8.9–10.3)
CHLORIDE: 98 mmol/L — AB (ref 101–111)
CO2: 25 mmol/L (ref 22–32)
Creatinine, Ser: 1.01 mg/dL (ref 0.61–1.24)
GFR calc non Af Amer: >60 mL/min (ref >60)
Glucose, Bld: 311 mg/dL — ABNORMAL HIGH (ref 65–99)
Potassium: 4.7 mmol/L (ref 3.5–5.1)
Sodium: 134 mmol/L — ABNORMAL LOW (ref 135–145)
Total Bilirubin: 0.5 mg/dL (ref 0.3–1.2)
Total Protein: 6.5 g/dL (ref 6.5–8.1)

## 2015-06-01 LAB — CBC
HCT: 45.6 % (ref 39.0–52.0)
Hemoglobin: 15.7 g/dL (ref 13.0–17.0)
MCH: 30.2 pg (ref 26.0–34.0)
MCHC: 34.4 g/dL (ref 30.0–36.0)
MCV: 87.7 fL (ref 78.0–100.0)
Platelets: 229 10*3/uL (ref 150–400)
RBC: 5.2 MIL/uL (ref 4.22–5.81)
RDW: 13.1 % (ref 11.5–15.5)
WBC: 11.4 10*3/uL — ABNORMAL HIGH (ref 4.0–10.5)

## 2015-06-01 LAB — PROTIME-INR
INR: 1.06 (ref 0.00–1.49)
Prothrombin Time: 14 seconds (ref 11.6–15.2)

## 2015-06-01 LAB — MRSA PCR SCREENING: MRSA BY PCR: NEGATIVE

## 2015-06-01 LAB — POCT ACTIVATED CLOTTING TIME: Activated Clotting Time: 528 seconds

## 2015-06-01 LAB — I-STAT TROPONIN, ED: Troponin i, poc: 3.96 ng/mL (ref 0.00–0.08)

## 2015-06-01 LAB — APTT: aPTT: 28 seconds (ref 24–37)

## 2015-06-01 SURGERY — LEFT HEART CATH AND CORONARY ANGIOGRAPHY
Anesthesia: LOCAL

## 2015-06-01 MED ORDER — SODIUM CHLORIDE 0.9 % IV SOLN: INTRAVENOUS | Status: DC

## 2015-06-01 MED ORDER — TICAGRELOR 90 MG PO TABS
ORAL_TABLET | ORAL | Status: DC | PRN
  Administered 2015-06-01: 180 mg via ORAL

## 2015-06-01 MED ORDER — TICAGRELOR 90 MG PO TABS
90.0000 mg | ORAL_TABLET | Freq: Two times a day (BID) | ORAL | Status: DC
  Administered 2015-06-02 – 2015-06-04 (×5): 90 mg via ORAL
  Filled 2015-06-01 (×6): qty 1

## 2015-06-01 MED ORDER — MIDAZOLAM HCL 2 MG/2ML IJ SOLN
INTRAMUSCULAR | Status: DC | PRN
  Administered 2015-06-01 (×2): 1 mg via INTRAVENOUS

## 2015-06-01 MED ORDER — FENTANYL CITRATE (PF) 100 MCG/2ML IJ SOLN
INTRAMUSCULAR | Status: DC | PRN
  Administered 2015-06-01 (×2): 25 ug via INTRAVENOUS

## 2015-06-01 MED ORDER — METOPROLOL TARTRATE 1 MG/ML IV SOLN
5.0000 mg | Freq: Once | INTRAVENOUS | Status: AC
  Administered 2015-06-01: 5 mg via INTRAVENOUS
  Filled 2015-06-01: qty 5

## 2015-06-01 MED ORDER — NITROGLYCERIN 1 MG/10 ML FOR IR/CATH LAB
INTRA_ARTERIAL | Status: DC | PRN
Start: 2015-06-01 — End: 2015-06-01
  Administered 2015-06-01: 200 ug via INTRACORONARY

## 2015-06-01 MED ORDER — TICAGRELOR 90 MG PO TABS
ORAL_TABLET | ORAL | Status: AC
  Filled 2015-06-01: qty 1

## 2015-06-01 MED ORDER — ASPIRIN 81 MG PO CHEW
81.0000 mg | CHEWABLE_TABLET | Freq: Every day | ORAL | Status: DC
  Administered 2015-06-01 – 2015-06-04 (×3): 81 mg via ORAL
  Filled 2015-06-01 (×2): qty 1

## 2015-06-01 MED ORDER — ONDANSETRON HCL 4 MG/2ML IJ SOLN
4.0000 mg | Freq: Once | INTRAMUSCULAR | Status: AC
  Administered 2015-06-01: 4 mg via INTRAVENOUS
  Filled 2015-06-01: qty 2

## 2015-06-01 MED ORDER — NITROGLYCERIN IN D5W 200-5 MCG/ML-% IV SOLN
0.0000 ug/min | Freq: Once | INTRAVENOUS | Status: AC
  Administered 2015-06-01: 5 ug/min via INTRAVENOUS
  Filled 2015-06-01: qty 250

## 2015-06-01 MED ORDER — SODIUM CHLORIDE 0.9 % IJ SOLN: 3.0000 mL | INTRAMUSCULAR | Status: DC | PRN

## 2015-06-01 MED ORDER — HEPARIN SODIUM (PORCINE) 1000 UNIT/ML IJ SOLN
INTRAMUSCULAR | Status: AC
  Filled 2015-06-01: qty 1

## 2015-06-01 MED ORDER — VERAPAMIL HCL 2.5 MG/ML IV SOLN
INTRAVENOUS | Status: AC
  Filled 2015-06-01: qty 2

## 2015-06-01 MED ORDER — SODIUM CHLORIDE 0.9 % IV SOLN
INTRAVENOUS | Status: DC | PRN
  Administered 2015-06-01: 50 mL/h via INTRAVENOUS

## 2015-06-01 MED ORDER — SODIUM CHLORIDE 0.9 % IV SOLN
250.0000 mg | INTRAVENOUS | Status: DC | PRN
  Administered 2015-06-01: 1.75 mg/kg/h via INTRAVENOUS

## 2015-06-01 MED ORDER — LIDOCAINE HCL (PF) 1 % IJ SOLN
INTRAMUSCULAR | Status: DC | PRN
  Administered 2015-06-01: 5 mL via INTRADERMAL

## 2015-06-01 MED ORDER — SODIUM CHLORIDE 0.9 % IJ SOLN
3.0000 mL | Freq: Two times a day (BID) | INTRAMUSCULAR | Status: DC
  Administered 2015-06-01: 3 mL via INTRAVENOUS

## 2015-06-01 MED ORDER — HEPARIN (PORCINE) IN NACL 2-0.9 UNIT/ML-% IJ SOLN
INTRAMUSCULAR | Status: AC
  Filled 2015-06-01: qty 500

## 2015-06-01 MED ORDER — HEPARIN SODIUM (PORCINE) 5000 UNIT/ML IJ SOLN
4000.0000 [IU] | INTRAMUSCULAR | Status: AC
  Administered 2015-06-01: 4000 [IU] via INTRAVENOUS
  Filled 2015-06-01: qty 1

## 2015-06-01 MED ORDER — NITROGLYCERIN 1 MG/10 ML FOR IR/CATH LAB
INTRA_ARTERIAL | Status: AC
  Filled 2015-06-01: qty 10

## 2015-06-01 MED ORDER — METOPROLOL TARTRATE 12.5 MG HALF TABLET
12.5000 mg | ORAL_TABLET | Freq: Two times a day (BID) | ORAL | Status: DC
  Administered 2015-06-01: 12.5 mg via ORAL
  Filled 2015-06-01 (×3): qty 1

## 2015-06-01 MED ORDER — FENTANYL CITRATE (PF) 100 MCG/2ML IJ SOLN
INTRAMUSCULAR | Status: AC
  Filled 2015-06-01: qty 2

## 2015-06-01 MED ORDER — SODIUM CHLORIDE 0.9 % IV SOLN: 250.0000 mL | INTRAVENOUS | Status: DC | PRN

## 2015-06-01 MED ORDER — BIVALIRUDIN 250 MG IV SOLR
INTRAVENOUS | Status: AC
  Filled 2015-06-01: qty 250

## 2015-06-01 MED ORDER — SODIUM CHLORIDE 0.9 % IJ SOLN
3.0000 mL | Freq: Two times a day (BID) | INTRAMUSCULAR | Status: DC
  Administered 2015-06-01 – 2015-06-02 (×2): 3 mL via INTRAVENOUS

## 2015-06-01 MED ORDER — ACETAMINOPHEN 325 MG PO TABS: 650.0000 mg | ORAL_TABLET | ORAL | Status: DC | PRN

## 2015-06-01 MED ORDER — LIDOCAINE HCL (PF) 1 % IJ SOLN
INTRAMUSCULAR | Status: AC
  Filled 2015-06-01: qty 30

## 2015-06-01 MED ORDER — ONDANSETRON HCL 4 MG/2ML IJ SOLN: 4.0000 mg | Freq: Four times a day (QID) | INTRAMUSCULAR | Status: DC | PRN

## 2015-06-01 MED ORDER — MIDAZOLAM HCL 2 MG/2ML IJ SOLN
INTRAMUSCULAR | Status: AC
  Filled 2015-06-01: qty 2

## 2015-06-01 MED ORDER — SODIUM CHLORIDE 0.9 % WEIGHT BASED INFUSION
3.0000 mL/kg/h | INTRAVENOUS | Status: AC
  Administered 2015-06-01: 3 mL/kg/h via INTRAVENOUS

## 2015-06-01 MED ORDER — HEPARIN (PORCINE) IN NACL 2-0.9 UNIT/ML-% IJ SOLN
INTRAMUSCULAR | Status: AC
Start: 2015-06-01 — End: 2015-06-01
  Filled 2015-06-01: qty 500

## 2015-06-01 MED ORDER — ASPIRIN 81 MG PO CHEW: 324.0000 mg | CHEWABLE_TABLET | Freq: Once | ORAL | Status: DC

## 2015-06-01 MED ORDER — IOHEXOL 350 MG/ML SOLN
INTRAVENOUS | Status: DC | PRN
Start: 2015-06-01 — End: 2015-06-01
  Administered 2015-06-01: 145 mL via INTRA_ARTERIAL

## 2015-06-01 MED ORDER — ATORVASTATIN CALCIUM 80 MG PO TABS
80.0000 mg | ORAL_TABLET | Freq: Every day | ORAL | Status: DC
  Administered 2015-06-02 – 2015-06-03 (×2): 80 mg via ORAL
  Filled 2015-06-01 (×3): qty 1

## 2015-06-01 MED ORDER — BIVALIRUDIN BOLUS VIA INFUSION - CUPID
INTRAVENOUS | Status: DC | PRN
  Administered 2015-06-01: 59.175 mg via INTRAVENOUS

## 2015-06-01 SURGICAL SUPPLY — 25 items
BALLN EMERGE MR 2.5X12 (BALLOONS) ×2
BALLN EUPHORA RX 2.0X20 (BALLOONS) ×2
BALLN ~~LOC~~ EUPHORA RX 3.75X12 (BALLOONS) ×2
BALLOON EMERGE MR 2.5X12 (BALLOONS) ×1 IMPLANT
BALLOON EUPHORA RX 2.0X20 (BALLOONS) ×1 IMPLANT
BALLOON ~~LOC~~ EUPHORA RX 3.75X12 (BALLOONS) ×1 IMPLANT
CATH INFINITI 5 FR JL3.5 (CATHETERS) ×2 IMPLANT
CATH INFINITI 5FR ANG PIGTAIL (CATHETERS) ×2 IMPLANT
CATH INFINITI 5FR MULTPACK ANG (CATHETERS) IMPLANT
CATH INFINITI JR4 5F (CATHETERS) ×2 IMPLANT
DEVICE RAD COMP TR BAND LRG (VASCULAR PRODUCTS) ×2 IMPLANT
ELECT DEFIB PAD ADLT CADENCE (PAD) ×2 IMPLANT
GLIDESHEATH SLEND SS 6F .021 (SHEATH) ×2 IMPLANT
GUIDE CATH RUNWAY 6FR AL 75 (CATHETERS) ×2 IMPLANT
KIT ENCORE 26 ADVANTAGE (KITS) ×2 IMPLANT
KIT HEART LEFT (KITS) ×2 IMPLANT
PACK CARDIAC CATHETERIZATION (CUSTOM PROCEDURE TRAY) ×2 IMPLANT
SHEATH PINNACLE 5F 10CM (SHEATH) IMPLANT
STENT PROMUS PREM MR 3.5X16 (Permanent Stent) ×2 IMPLANT
SYR MEDRAD MARK V 150ML (SYRINGE) ×2 IMPLANT
TRANSDUCER W/STOPCOCK (MISCELLANEOUS) ×2 IMPLANT
TUBING CIL FLEX 10 FLL-RA (TUBING) ×2 IMPLANT
WIRE ASAHI PROWATER 180CM (WIRE) ×2 IMPLANT
WIRE EMERALD 3MM-J .035X150CM (WIRE) IMPLANT
WIRE SAFE-T 1.5MM-J .035X260CM (WIRE) ×2 IMPLANT

## 2015-06-01 NOTE — ED Notes (Signed)
[facility]- pt c/o chest pain since 3pm yesterday. Also c/o shortness of breath and nausea. Pt took 324 ASA at home. EMS gave 1ntg with no relief. Vitals stable, IV access intact.

## 2015-06-01 NOTE — Progress Notes (Signed)
eLink Physician-Brief Progress Note Patient Name: [name] DOB: May 14, 1941 MRN: [id number]   Date of Service  06/01/2015  HPI/Events of Note  Pt arrived from cath lab s/p stents to RCA for STEMI, hemodynically stable/ no resp distress  eICU Interventions  orderd reviewed/ No interventions needed      Intervention Category Minor Interventions: Other:  Christinia Gully 06/01/2015, 8:48 PM

## 2015-06-01 NOTE — Interval H&P Note (Signed)
History and Physical Interval Note:  06/01/2015 6:41 PM  [name]  has presented today for surgery, with the diagnosis of STEMI  The various methods of treatment have been discussed with the patient and family. After consideration of risks, benefits and other options for treatment, the patient has consented to  Procedure(s): Left Heart Cath and Coronary Angiography (N/A) as a surgical intervention .  The patient's history has been reviewed, patient examined, no change in status, stable for surgery.  I have reviewed the patient's chart and labs.  Questions were answered to the patient's satisfaction.   Cath Lab Visit (complete for each Cath Lab visit)  Clinical Evaluation Leading to the Procedure:   ACS: Yes.    Non-ACS:    Anginal Classification: CCS IV  Anti-ischemic medical therapy: No Therapy  Non-Invasive Test Results: No non-invasive testing performed  Prior CABG: No previous CABG        Lavonne Cass Martin[place] MD,FACC 06/01/2015 6:42 PM

## 2015-06-01 NOTE — Progress Notes (Signed)
Chaplain responded to Code STEMI page for pt in D-33. Pt declined chaplain's offer to call anyone on his behalf. Pt stated that he is divorced, has a daughter in [place], and that no one will be coming to the hospital tonight. Chaplain spoke encouraging words to pt regarding upcoming procedure and pt expressed appreciation for chaplain's visit.

## 2015-06-01 NOTE — H&P (Signed)
History and Physical   Admit date: 06/01/2015 Name:  [name] Medical record number: [id number] DOB/Age:  1941/02/27  74 y.o. male  Referring Physician: [facility]  Primary Physician:   [place] [facility]   Chief complaint/reason for admission: Chest pain   HPI:  This 74 year old male has a history of diabetes with peripheral neuropathy, hypertension, hyperlipidemia, and tobacco abuse.  He was admitted here with encephalopathy about 2 years ago and there may be a prior history of stroke.  The patient says that he threw up blood a couple of years ago and received transfusions at the [place] but there is no record of that that I can tell in the old records.  He had the onset of chest discomfort yesterday that lasted around 20 minutes and radiating to the left side.  After going to church this morning he had the onset of substernal chest discomfort around 2 PM it persisted and EMS was called.  Initial EKG showed ST elevation in the inferior leads.  Troponin returned elevated at 3.96.  EKG showed some mild residual ST elevation he has had recurrence of pain since he has been in the emergency room.  He currently lives alone and is a somewhat poor historian.  Does have history of diabetes and confusion.  GI history as noted above but somewhat sketchy.  He does have a history of melanoma few years ago.  There is also a history of stroke but he states it is questionable whether or not that is truly an issue.    Past Medical History  Diagnosis Date  . Diabetes mellitus   . Hypertension   . Melanoma       History reviewed. No pertinent past surgical history..  Allergies: is allergic to penicillins.   Medications: Prior to Admission medications   Medication Sig Start Date End Date Taking? Authorizing Provider  ascorbic acid (VITAMIN C) 500 MG tablet Take 1,000 mg by mouth daily.    Historical Provider, MD  aspirin EC 81 MG tablet Take 81 mg by mouth daily.     Historical Provider, MD  atorvastatin (LIPITOR) 80 MG tablet Take 40 mg by mouth daily.    Historical Provider, MD  Cholecalciferol 2000 UNITS CAPS Take 1 capsule by mouth every morning.    Historical Provider, MD  cyanocobalamin 500 MCG tablet Take 1,000 mcg by mouth daily.    Historical Provider, MD  dicyclomine (BENTYL) 10 MG capsule Take 1 capsule (10 mg total) by mouth 4 (four) times daily as needed (abdominal pain). 06/18/13   Barton Dubois, MD  ferrous sulfate 325 (65 FE) MG tablet Take 325 mg by mouth daily with breakfast.    Historical Provider, MD  gabapentin (NEURONTIN) 400 MG capsule Take 800 mg by mouth 3 (three) times daily.    Historical Provider, MD  glipiZIDE (GLUCOTROL) 5 MG tablet Take 1 tablet (5 mg total) by mouth 2 (two) times daily before a meal. 06/18/13   Barton Dubois, MD  hydrOXYzine (ATARAX/VISTARIL) 10 MG tablet Take 2 tablets (20 mg total) by mouth every 8 (eight) hours as needed for itching or anxiety. 06/18/13   Barton Dubois, MD  lisinopril (PRINIVIL,ZESTRIL) 20 MG tablet Take 1 tablet (20 mg total) by mouth daily. Hold for SBP <100 06/18/13   Barton Dubois, MD  Magnesium Oxide 420 MG TABS Take 2 tablets by mouth 2 (two) times daily.    Historical Provider, MD  metFORMIN (GLUCOPHAGE) 500 MG tablet Take 2 tablets (1,000 mg  total) by mouth 2 (two) times daily with a meal. 06/18/13   Barton Dubois, MD  methocarbamol (ROBAXIN) 500 MG tablet Take 1 tablet (500 mg total) by mouth every 8 (eight) hours as needed. 06/18/13   Barton Dubois, MD  niacin (SLO-NIACIN) 500 MG tablet Take 500 mg by mouth at bedtime.    Historical Provider, MD  pantoprazole (PROTONIX) 40 MG tablet Take 40 mg by mouth daily.    Historical Provider, MD  pioglitazone (ACTOS) 45 MG tablet Take 45 mg by mouth daily.    Historical Provider, MD  propranolol ER (INDERAL LA) 80 MG 24 hr capsule Take 80 mg by mouth daily. To prevent headache.    Historical Provider, MD  traMADol (ULTRAM) 50 MG tablet Take 1 tablet  (50 mg total) by mouth every 6 (six) hours as needed for pain. 06/18/13   Barton Dubois, MD    Family History:  Family Status  Relation Status Death Age  . Father Deceased 47    died of old age  . Mother Deceased 63    died of old age    Social History:   reports that he has been smoking Cigarettes.  He has a 45 pack-year smoking history. He uses smokeless tobacco. He reports that he does not drink alcohol or use illicit drugs.   History   Social History Narrative     Review of Systems:  Other than as noted above, the remainder of the review of systems is normal  Physical Exam: BP 198/95 mmHg  Pulse 68  Temp(Src) 98.7 F (37.1 C) (Oral)  Resp 19  Ht 6' (1.829 m)  Wt 78.926 kg (174 lb)  BMI 23.59 kg/m2  SpO2 97% General appearance: Pleasant thin white male currently in no acute distress but complaining of chest pain Head: Normocephalic, without obvious abnormality, atraumatic, Balding male hair pattern Eyes: conjunctivae/corneas clear. PERRL, EOM's intact. Fundi not examined  Neck: no adenopathy, no carotid bruit, no JVD and supple, symmetrical, trachea midline Lungs: clear to auscultation bilaterally Heart: regular rate and rhythm, S1, S2 normal, no murmur, click, rub or gallop Abdomen: soft, non-tender; bowel sounds normal; no masses,  no organomegaly Rectal: deferred Extremities: extremities normal, atraumatic, no cyanosis or edema Pulses: 2+ and symmetric Skin: Skin color, texture, turgor normal. No rashes or lesions Neurologic: Grossly normal   Labs: CBC  Recent Labs  06/01/15 1728  WBC 11.4*  RBC 5.20  HGB 15.7  HCT 45.6  PLT 229  MCV 87.7  MCH 30.2  MCHC 34.4  RDW 13.1   CMP     Cardiac Panel (last 3 results) Troponin (Point of Care Test)  Recent Labs  06/01/15 1739  TROPIPOC 3.96*   Thyroid  Lab Results  Component Value Date   TSH 0.863 06/13/2013    EKG: Acute inferior infarction on initial EKGs, repeat EKG shows improvement of  the ST elevation but some still persists.  Radiology: Large hiatal hernia, thoracic atherosclerosis, tortuous aorta, clear lung fields   IMPRESSIONS: 1.  Acute inferior infarction-initial episode 2.  Type 2 diabetes mellitus with peripheral neuropathy 3.  Hypertension currently above goal 4.  Hyperlipidemia 5.  Reported history of alcohol abuse although patient states he hasn't had alcohol in 30 years 6.  Reported history of TIA and stroke in old chart but no evidence on MRI as late as 2014 and no residual defects  7.  Reported history of GI bleeding several years ago 8.  History of carotid atherosclerosis documented by CT  angiogram in 2010 9.  Thoracic atherosclerosis  PLAN: She was taken to the catheterization laboratory for acute intervention.  He has ongoing chest pain with an abnormal EKG and positive troponin.  History will need to be sorted out  Signed: W. Doristine Church MD [facility]  06/01/2015, 6:26 PM

## 2015-06-01 NOTE — ED Notes (Signed)
Cardiology at bedside.

## 2015-06-01 NOTE — ED Notes (Signed)
NOTIFIED DR. BEATON FOR PATIENTS LAB RESULTS OFI-STAT TROPONIN @17 :52PM .

## 2015-06-01 NOTE — ED Provider Notes (Signed)
CSN: [id number]     Arrival date & time 06/01/15  1652 History   First MD Initiated Contact with Patient 06/01/15 1654     Chief Complaint  Patient presents with  . Chest Pain  . Shortness of Breath   (Consider location/radiation/quality/duration/timing/severity/associated sxs/prior Treatment) Patient is a 74 y.o. male presenting with chest pain. The history is provided by the patient. No language interpreter was used.  Chest Pain Pain location:  Substernal area Pain quality: pressure and shooting   Pain radiates to:  Mid back, neck, L shoulder and epigastrium Pain radiates to the back: yes   Pain severity:  Moderate Onset quality:  Sudden Timing:  Intermittent Progression:  Waxing and waning Chronicity:  New Context: at rest   Relieved by:  Nothing Worsened by:  Nothing tried Ineffective treatments:  None tried Associated symptoms: diaphoresis, dizziness, nausea and shortness of breath   Associated symptoms: no abdominal pain, no altered mental status, no anorexia, no anxiety, no cough, no fatigue, no fever, no headache, no palpitations, not vomiting and no weakness   Risk factors: diabetes mellitus, high cholesterol, hypertension, male sex and smoking   Risk factors: no coronary artery disease and no prior DVT/PE     Past Medical History  Diagnosis Date  . Diabetes mellitus   . Hypertension    No past surgical history on file. No family history on file. History  Substance Use Topics  . Smoking status: Current Every Day Smoker -- 1.00 packs/day for 1 years    Types: Cigarettes  . Smokeless tobacco: Not on file  . Alcohol Use: Yes     Comment: "not as a habit, but as a rule I am forced to"    Review of Systems  Constitutional: Positive for diaphoresis. Negative for fever, chills and fatigue.  Respiratory: Positive for shortness of breath. Negative for cough and chest tightness.   Cardiovascular: Positive for chest pain. Negative for palpitations.  Gastrointestinal:  Positive for nausea. Negative for vomiting, abdominal pain and anorexia.  Neurological: Positive for dizziness. Negative for weakness, light-headedness and headaches.  Psychiatric/Behavioral: Negative for confusion.  All other systems reviewed and are negative.     Allergies  Penicillins  Home Medications   Prior to Admission medications   Medication Sig Start Date End Date Taking? Authorizing Provider  ascorbic acid (VITAMIN C) 500 MG tablet Take 1,000 mg by mouth daily.    Historical Provider, MD  aspirin EC 81 MG tablet Take 81 mg by mouth daily.    Historical Provider, MD  atorvastatin (LIPITOR) 80 MG tablet Take 40 mg by mouth daily.    Historical Provider, MD  Cholecalciferol 2000 UNITS CAPS Take 1 capsule by mouth every morning.    Historical Provider, MD  cyanocobalamin 500 MCG tablet Take 1,000 mcg by mouth daily.    Historical Provider, MD  dicyclomine (BENTYL) 10 MG capsule Take 1 capsule (10 mg total) by mouth 4 (four) times daily as needed (abdominal pain). 06/18/13   Barton Dubois, MD  ferrous sulfate 325 (65 FE) MG tablet Take 325 mg by mouth daily with breakfast.    Historical Provider, MD  gabapentin (NEURONTIN) 400 MG capsule Take 800 mg by mouth 3 (three) times daily.    Historical Provider, MD  glipiZIDE (GLUCOTROL) 5 MG tablet Take 1 tablet (5 mg total) by mouth 2 (two) times daily before a meal. 06/18/13   Barton Dubois, MD  hydrOXYzine (ATARAX/VISTARIL) 10 MG tablet Take 2 tablets (20 mg total) by mouth every  8 (eight) hours as needed for itching or anxiety. 06/18/13   Barton Dubois, MD  lisinopril (PRINIVIL,ZESTRIL) 20 MG tablet Take 1 tablet (20 mg total) by mouth daily. Hold for SBP <100 06/18/13   Barton Dubois, MD  Magnesium Oxide 420 MG TABS Take 2 tablets by mouth 2 (two) times daily.    Historical Provider, MD  metFORMIN (GLUCOPHAGE) 500 MG tablet Take 2 tablets (1,000 mg total) by mouth 2 (two) times daily with a meal. 06/18/13   Barton Dubois, MD  methocarbamol  (ROBAXIN) 500 MG tablet Take 1 tablet (500 mg total) by mouth every 8 (eight) hours as needed. 06/18/13   Barton Dubois, MD  niacin (SLO-NIACIN) 500 MG tablet Take 500 mg by mouth at bedtime.    Historical Provider, MD  pantoprazole (PROTONIX) 40 MG tablet Take 40 mg by mouth daily.    Historical Provider, MD  pioglitazone (ACTOS) 45 MG tablet Take 45 mg by mouth daily.    Historical Provider, MD  propranolol ER (INDERAL LA) 80 MG 24 hr capsule Take 80 mg by mouth daily. To prevent headache.    Historical Provider, MD  traMADol (ULTRAM) 50 MG tablet Take 1 tablet (50 mg total) by mouth every 6 (six) hours as needed for pain. 06/18/13   Barton Dubois, MD   ED Triage Vitals  Enc Vitals Group     BP 06/01/15 1700 184/99 mmHg     Pulse Rate 06/01/15 1700 73     Resp 06/01/15 1700 16     Temp 06/01/15 1701 98.7 F (37.1 C)     Temp Source 06/01/15 1701 Oral     SpO2 06/01/15 1700 98 %     Weight 06/01/15 1701 174 lb (78.926 kg)     Height 06/01/15 1701 6' (1.829 m)     Head Cir --      Peak Flow --      Pain Score 06/01/15 1702 6     Pain Loc --      Pain Edu? --      Excl. in [place]? --     Physical Exam  Constitutional: He is oriented to person, place, and time. He appears well-developed and well-nourished. He does not appear ill. No distress.  HENT:  Head: Normocephalic and atraumatic.  Nose: Nose normal.  Mouth/Throat: Oropharynx is clear and moist. No oropharyngeal exudate.  Eyes: EOM are normal. Pupils are equal, round, and reactive to light.  Neck: Normal range of motion. Neck supple.  Cardiovascular: Normal rate, regular rhythm, normal heart sounds and intact distal pulses.   No murmur heard. Pulmonary/Chest: Effort normal and breath sounds normal. No respiratory distress. He has no wheezes. He exhibits no tenderness.  Abdominal: Soft. He exhibits no distension. There is no tenderness. There is no guarding.  Musculoskeletal: Normal range of motion. He exhibits no tenderness.   Neurological: He is alert and oriented to person, place, and time. No cranial nerve deficit. Coordination normal.  Skin: Skin is warm and dry. He is not diaphoretic. No pallor.  Psychiatric: He has a normal mood and affect. His behavior is normal. Judgment and thought content normal.  Nursing note and vitals reviewed.   ED Course  Procedures (including critical care time) Labs Review Labs Reviewed - No data to display  Imaging Review Dg Chest 2 View  06/01/2015   CLINICAL DATA:  Chest pain. Shortness of breath. Nonproductive cough.  EXAM: CHEST  2 VIEW  COMPARISON:  06/16/2013  FINDINGS: Heart size and pulmonary  vascularity are normal. The lungs are clear. No effusions.  There is tortuosity and calcification of the thoracic aorta. There is a large hiatal hernia.  Numerous old bilateral rib fractures.  IMPRESSION: No acute abnormality. Aortic atherosclerosis. Large distended hiatal hernia.   Electronically Signed   By: Lorriane Shire M.D.   On: 06/01/2015 17:58     EKG Interpretation   Date/Time:  Sunday June 01 2015 17:18:06 EDT Ventricular Rate:  95 PR Interval:  199 QRS Duration: 88 QT Interval:  361 QTC Calculation: 454 R Axis:   37 Text Interpretation:  Sinus rhythm Ventricular premature complex Aberrant  conduction of SV complex(es) Baseline wander in lead(s) II ED PHYSICIAN  INTERPRETATION AVAILABLE IN [facility] Confirmed by TEST, Record  ([id number]) on 06/02/2015 12:57:27 PM      MDM   Final diagnoses:  ST elevation myocardial infarction (STEMI), unspecified artery   Pt is a 74 yo M with hx of DM, HTN and tobacco abuse who presented to the ED today complaining of intermittent chest pain and SOB.  Mild chest pressure started this AM at rest.  Intermittent chest pain has come/gone throughout the day.  Lasts for a few seconds then resolves completely.  Associated nausea, diaphoresis, and SOB.  Took ASA 324 at home while on the phone with 911 prior to arrival.    Somewhat  hypertensive on arrival to 376 systolic.    Initial EKG with ? ST elevations vs repolarization abnormalities, but overall not diagnostic.  As he was chest pain free on arrival and EKG was not diagnostic, it was decided to send istat labs to evaluate further.  Given IV labetalol for his HTN.  Bilateral IVs started.    0/10 pain initially, then had a episode of < 10 seconds of chest pain, where he got diaphoretic, pale, and nauseated.   EKG immediately after is unchanged from initial.  Still not specific.    istat troponin returned elevated at 3.96.   Patient informed of our concern for acute MI.  A code STEMI was called out after the lab returned and he was started on heparin.  Cardiology to ED bedside.   To go to cardiac cath lab for emergent LHC.   Pt aware and agreeable.  Stable to go to the cath lab when it became available.    If performed, labs, EKGs, and imaging were reviewed and interpreted by myself and my attending, and incorporated in the medical decision making.  Patient was seen with ED Attending, Dr. Lucina Mellow, MD    Tori Milks, MD 06/03/15 2831  Leonard Schwartz, MD 06/03/15 [phone]

## 2015-06-02 ENCOUNTER — Inpatient Hospital Stay (HOSPITAL_COMMUNITY): Payer: Medicare Other

## 2015-06-02 DIAGNOSIS — E1140 Type 2 diabetes mellitus with diabetic neuropathy, unspecified: Secondary | ICD-10-CM

## 2015-06-02 DIAGNOSIS — I700 Atherosclerosis of aorta: Secondary | ICD-10-CM

## 2015-06-02 DIAGNOSIS — I2119 ST elevation (STEMI) myocardial infarction involving other coronary artery of inferior wall: Secondary | ICD-10-CM

## 2015-06-02 LAB — GLUCOSE, CAPILLARY
GLUCOSE-CAPILLARY: 203 mg/dL — AB (ref 65–99)
GLUCOSE-CAPILLARY: 196 mg/dL — AB (ref 65–99)
Glucose-Capillary: 206 mg/dL — ABNORMAL HIGH (ref 65–99)
Glucose-Capillary: 169 mg/dL — ABNORMAL HIGH (ref 65–99)

## 2015-06-02 LAB — BASIC METABOLIC PANEL
ANION GAP: 12 (ref 5–15)
BUN: 12 mg/dL (ref 6–20)
CO2: 23 mmol/L (ref 22–32)
CREATININE: 0.87 mg/dL (ref 0.61–1.24)
Calcium: 9.4 mg/dL (ref 8.9–10.3)
Chloride: 98 mmol/L — ABNORMAL LOW (ref 101–111)
GFR calc Af Amer: >60 mL/min (ref >60)
GFR calc non Af Amer: >60 mL/min (ref >60)
GLUCOSE: 226 mg/dL — AB (ref 65–99)
POTASSIUM: 4.4 mmol/L (ref 3.5–5.1)
Sodium: 133 mmol/L — ABNORMAL LOW (ref 135–145)

## 2015-06-02 LAB — CBC
HEMATOCRIT: 42.6 % (ref 39.0–52.0)
Hemoglobin: 14.5 g/dL (ref 13.0–17.0)
MCH: 29.2 pg (ref 26.0–34.0)
MCHC: 34 g/dL (ref 30.0–36.0)
MCV: 85.9 fL (ref 78.0–100.0)
PLATELETS: 219 10*3/uL (ref 150–400)
RBC: 4.96 MIL/uL (ref 4.22–5.81)
RDW: 13.1 % (ref 11.5–15.5)
WBC: 11.9 10*3/uL — ABNORMAL HIGH (ref 4.0–10.5)

## 2015-06-02 MED ORDER — METOPROLOL TARTRATE 25 MG PO TABS
25.0000 mg | ORAL_TABLET | Freq: Two times a day (BID) | ORAL | Status: DC
  Administered 2015-06-02 – 2015-06-04 (×4): 25 mg via ORAL
  Filled 2015-06-02 (×5): qty 1

## 2015-06-02 MED ORDER — SODIUM CHLORIDE 0.9 % WEIGHT BASED INFUSION
1.0000 mL/kg/h | INTRAVENOUS | Status: DC
  Administered 2015-06-02: 1 mL/kg/h via INTRAVENOUS

## 2015-06-02 MED ORDER — GLIPIZIDE 5 MG PO TABS
5.0000 mg | ORAL_TABLET | Freq: Two times a day (BID) | ORAL | Status: DC
  Administered 2015-06-02 – 2015-06-04 (×3): 5 mg via ORAL
  Filled 2015-06-02 (×6): qty 1

## 2015-06-02 MED ORDER — SODIUM CHLORIDE 0.9 % IJ SOLN: 3.0000 mL | INTRAMUSCULAR | Status: DC | PRN

## 2015-06-02 MED ORDER — SODIUM CHLORIDE 0.9 % IJ SOLN
3.0000 mL | Freq: Two times a day (BID) | INTRAMUSCULAR | Status: DC
  Administered 2015-06-02: 3 mL via INTRAVENOUS

## 2015-06-02 MED ORDER — INSULIN ASPART 100 UNIT/ML ~~LOC~~ SOLN
0.0000 [IU] | Freq: Three times a day (TID) | SUBCUTANEOUS | Status: DC
  Administered 2015-06-02: 2 [IU] via SUBCUTANEOUS
  Administered 2015-06-02 – 2015-06-03 (×3): 3 [IU] via SUBCUTANEOUS
  Administered 2015-06-03 – 2015-06-04 (×2): 2 [IU] via SUBCUTANEOUS

## 2015-06-02 MED ORDER — LISINOPRIL 5 MG PO TABS
5.0000 mg | ORAL_TABLET | Freq: Every day | ORAL | Status: DC
  Administered 2015-06-02 – 2015-06-04 (×3): 5 mg via ORAL
  Filled 2015-06-02 (×4): qty 1

## 2015-06-02 MED ORDER — SODIUM CHLORIDE 0.9 % IV SOLN: 250.0000 mL | INTRAVENOUS | Status: DC | PRN

## 2015-06-02 MED ORDER — OXYCODONE-ACETAMINOPHEN 5-325 MG PO TABS
1.0000 | ORAL_TABLET | Freq: Four times a day (QID) | ORAL | Status: DC | PRN
  Administered 2015-06-02: 1 via ORAL
  Filled 2015-06-02: qty 1

## 2015-06-02 MED ORDER — ASPIRIN 81 MG PO CHEW: 81.0000 mg | CHEWABLE_TABLET | ORAL | Status: AC

## 2015-06-02 NOTE — Care Management Note (Signed)
Case Management Note  Patient Details  Name: [name] MRN: [id number] Date of Birth: 1941-10-20  Subjective/Objective:     Adm w mi               Action/Plan: lives at home, [facility]   Expected Discharge Date:                  Expected Discharge Plan:  Home/Self Care  In-House Referral:     Discharge planning Services  CM Consult, Medication Assistance  Post Acute Care Choice:    Choice offered to:     DME Arranged:    DME Agency:     HH Arranged:    [place] Agency:     Status of Service:     Medicare Important Message Given:    Date Medicare IM Given:    Medicare IM give by:    Date Additional Medicare IM Given:    Additional Medicare Important Message give by:     If discussed at [place] of Stay Meetings, dates discussed:    Additional Comments: ur review done, gave pt 30day free brilinta card. Gets meds at [place]  Lacretia Leigh, RN 06/02/2015, 10:14 AM

## 2015-06-02 NOTE — Progress Notes (Addendum)
Subjective:  No chest pain, no SOB. Saturday, pain started. Worried about cats at home.   Objective:  Vital Signs in the last 24 hours: Temp:  [97.8 F (36.6 C)-98.9 F (37.2 C)] 98.9 F (37.2 C) (07/04 0800) Pulse Rate:  [0-99] 68 (07/04 0800) Resp:  [0-24] 24 (07/04 0800) BP: (95-198)/(26-165) 159/84 mmHg (07/04 0800) SpO2:  [0 %-100 %] 97 % (07/04 0800) Weight:  [150 lb 5.7 oz (68.2 kg)-174 lb (78.926 kg)] 150 lb 5.7 oz (68.2 kg) (07/03 2000)  Intake/Output from previous day: 07/03 0701 - 07/04 0700 In: 1060.1 [I.V.:1060.1] Out: 625 [Urine:625]   Physical Exam: General: Well developed, well nourished, in no acute distress. Head:  Normocephalic and atraumatic. Lungs: Clear to auscultation and percussion. Heart: Normal S1 and S2.  No murmur, rubs or gallops.  Abdomen: soft, non-tender, positive bowel sounds. Extremities: No clubbing or cyanosis. No edema. Mild bruising right wrist at cath site Neurologic: Alert and oriented x 3.    Lab Results:  Recent Labs  06/01/15 1728 06/02/15 0222  WBC 11.4* 11.9*  HGB 15.7 14.5  PLT 229 219    Recent Labs  06/01/15 1728 06/02/15 0222  NA 134* 133*  K 4.7 4.4  CL 98* 98*  CO2 25 23  GLUCOSE 311* 226*  BUN 14 12  CREATININE 1.01 0.87   No results for input(s): TROPONINI in the last 72 hours.  Invalid input(s): CK, MB Hepatic Function Panel  Recent Labs  06/01/15 1728  PROT 6.5  ALBUMIN 4.1  AST 39  ALT 15*  ALKPHOS 99  BILITOT 0.5   No results for input(s): CHOL in the last 72 hours. No results for input(s): PROTIME in the last 72 hours.  Imaging: Dg Chest 2 View  06/01/2015   CLINICAL DATA:  Chest pain. Shortness of breath. Nonproductive cough.  EXAM: CHEST  2 VIEW  COMPARISON:  06/16/2013  FINDINGS: Heart size and pulmonary vascularity are normal. The lungs are clear. No effusions.  There is tortuosity and calcification of the thoracic aorta. There is a large hiatal hernia.  Numerous old bilateral  rib fractures.  IMPRESSION: No acute abnormality. Aortic atherosclerosis. Large distended hiatal hernia.   Electronically Signed   By: Lorriane Shire M.D.   On: 06/01/2015 17:58   Personally viewed.   Telemetry: 4 beats NSVT (reperfusion), NSR Personally viewed.   EKG:  STEMI inf  Cardiac Studies:  LAD 80%, RCA post DES. 45% EF  Scheduled Meds: . aspirin  81 mg Oral Daily  . atorvastatin  80 mg Oral q1800  . insulin aspart  0-9 Units Subcutaneous TID WC  . metoprolol tartrate  12.5 mg Oral BID  . sodium chloride  3 mL Intravenous Q12H  . ticagrelor  90 mg Oral BID   Continuous Infusions:  PRN Meds:.sodium chloride, acetaminophen, ondansetron (ZOFRAN) IV, sodium chloride  Assessment/Plan:  Principal Problem:   Myocardial infarction, inferior, acute, initial episode Active Problems:   Atherosclerosis of aorta   Type 2 diabetes mellitus with peripheral neuropathy   ST elevation myocardial infarction (STEMI) involving right coronary artery in recovery phase  74 year old with INF STEMI  STEMI  - RCA DES  - ASA Brilinta  - increase metoprolol to 25 BID  - start lisinopril 5 mg (BP 160)  - high intensity statin, atorva 80  Mild LV systolic dysfunction  - 37% EF  - Will start ACE-I as above  - Bb  - no signs of overload  NSVT  -  likely reperfusion  - Bb, increase, watch for bradycardia  CAD  - residual LAD 80%  - cath tomorrow  - hydrate  - orders placed  - Dr. [place]  DM  - Glucose 200's  - restart home glipizide 5mg  BID  - continue to hold metformin and pioglitazone  - ISS  Possibly home 7/6 OK to transfer to stepdown   [name], [place] 06/02/2015, 9:04 AM

## 2015-06-03 ENCOUNTER — Encounter (HOSPITAL_COMMUNITY)
Admission: EM | Disposition: A | Discharge status: Home / Self Care | Payer: Non-veteran care | Source: Home / Self Care | Attending: Cardiology

## 2015-06-03 ENCOUNTER — Encounter (HOSPITAL_COMMUNITY): Payer: Self-pay | Admitting: Cardiology

## 2015-06-03 DIAGNOSIS — I2510 Atherosclerotic heart disease of native coronary artery without angina pectoris: Secondary | ICD-10-CM

## 2015-06-03 HISTORY — PX: CARDIAC CATHETERIZATION: SHX172

## 2015-06-03 LAB — BASIC METABOLIC PANEL
Anion gap: 7 (ref 5–15)
BUN: 17 mg/dL (ref 6–20)
CALCIUM: 8.9 mg/dL (ref 8.9–10.3)
CHLORIDE: 104 mmol/L (ref 101–111)
CO2: 25 mmol/L (ref 22–32)
CREATININE: 0.84 mg/dL (ref 0.61–1.24)
GFR calc Af Amer: >60 mL/min (ref >60)
GFR calc non Af Amer: >60 mL/min (ref >60)
Glucose, Bld: 208 mg/dL — ABNORMAL HIGH (ref 65–99)
Potassium: 4.2 mmol/L (ref 3.5–5.1)
Sodium: 136 mmol/L (ref 135–145)

## 2015-06-03 LAB — GLUCOSE, CAPILLARY
GLUCOSE-CAPILLARY: 203 mg/dL — AB (ref 65–99)
Glucose-Capillary: 120 mg/dL — ABNORMAL HIGH (ref 65–99)
Glucose-Capillary: 192 mg/dL — ABNORMAL HIGH (ref 65–99)
Glucose-Capillary: 96 mg/dL (ref 65–99)

## 2015-06-03 LAB — CBC
HCT: 37.5 % — ABNORMAL LOW (ref 39.0–52.0)
HEMOGLOBIN: 12.8 g/dL — AB (ref 13.0–17.0)
MCH: 30 pg (ref 26.0–34.0)
MCHC: 34.1 g/dL (ref 30.0–36.0)
MCV: 87.8 fL (ref 78.0–100.0)
Platelets: 171 10*3/uL (ref 150–400)
RBC: 4.27 MIL/uL (ref 4.22–5.81)
RDW: 13.4 % (ref 11.5–15.5)
WBC: 8.3 10*3/uL (ref 4.0–10.5)

## 2015-06-03 LAB — POCT ACTIVATED CLOTTING TIME: ACTIVATED CLOTTING TIME: 466 s

## 2015-06-03 SURGERY — CORONARY STENT INTERVENTION
Anesthesia: LOCAL

## 2015-06-03 MED ORDER — ANGIOPLASTY BOOK
Freq: Once | Status: AC
  Administered 2015-06-03: 21:00:00
  Filled 2015-06-03: qty 1

## 2015-06-03 MED ORDER — HEPARIN (PORCINE) IN NACL 2-0.9 UNIT/ML-% IJ SOLN
INTRAMUSCULAR | Status: AC
  Filled 2015-06-03: qty 1000

## 2015-06-03 MED ORDER — NITROGLYCERIN 1 MG/10 ML FOR IR/CATH LAB
INTRA_ARTERIAL | Status: AC
  Filled 2015-06-03: qty 10

## 2015-06-03 MED ORDER — HEART ATTACK BOUNCING BOOK
Freq: Once | Status: AC
  Administered 2015-06-03: 23:00:00
  Filled 2015-06-03: qty 1

## 2015-06-03 MED ORDER — LIDOCAINE HCL (PF) 1 % IJ SOLN
INTRAMUSCULAR | Status: DC | PRN
Start: 2015-06-03 — End: 2015-06-03
  Administered 2015-06-03: 2 mL via SUBCUTANEOUS

## 2015-06-03 MED ORDER — SODIUM CHLORIDE 0.9 % IJ SOLN: 3.0000 mL | INTRAMUSCULAR | Status: DC | PRN

## 2015-06-03 MED ORDER — VERAPAMIL HCL 2.5 MG/ML IV SOLN
INTRAVENOUS | Status: AC
  Filled 2015-06-03: qty 2

## 2015-06-03 MED ORDER — SODIUM CHLORIDE 0.9 % WEIGHT BASED INFUSION: 1.0000 mL/kg/h | INTRAVENOUS | Status: DC

## 2015-06-03 MED ORDER — MIDAZOLAM HCL 2 MG/2ML IJ SOLN
INTRAMUSCULAR | Status: DC | PRN
  Administered 2015-06-03: 1 mg via INTRAVENOUS

## 2015-06-03 MED ORDER — SODIUM CHLORIDE 0.9 % IV SOLN: 250.0000 mL | INTRAVENOUS | Status: DC | PRN

## 2015-06-03 MED ORDER — MIDAZOLAM HCL 2 MG/2ML IJ SOLN
INTRAMUSCULAR | Status: AC
  Filled 2015-06-03: qty 2

## 2015-06-03 MED ORDER — SODIUM CHLORIDE 0.9 % IJ SOLN
3.0000 mL | Freq: Two times a day (BID) | INTRAMUSCULAR | Status: DC
  Administered 2015-06-03: 13:00:00 3 mL via INTRAVENOUS

## 2015-06-03 MED ORDER — HEPARIN SODIUM (PORCINE) 1000 UNIT/ML IJ SOLN
INTRAMUSCULAR | Status: AC
  Filled 2015-06-03: qty 1

## 2015-06-03 MED ORDER — SODIUM CHLORIDE 0.9 % IJ SOLN: 3.0000 mL | Freq: Two times a day (BID) | INTRAMUSCULAR | Status: DC

## 2015-06-03 MED ORDER — FENTANYL CITRATE (PF) 100 MCG/2ML IJ SOLN
INTRAMUSCULAR | Status: DC | PRN
  Administered 2015-06-03: 25 ug via INTRAVENOUS

## 2015-06-03 MED ORDER — SODIUM CHLORIDE 0.9 % WEIGHT BASED INFUSION: 3.0000 mL/kg/h | INTRAVENOUS | Status: AC

## 2015-06-03 MED ORDER — BIVALIRUDIN BOLUS VIA INFUSION - CUPID
INTRAVENOUS | Status: DC | PRN
  Administered 2015-06-03: 49.2 mg via INTRAVENOUS

## 2015-06-03 MED ORDER — FENTANYL CITRATE (PF) 100 MCG/2ML IJ SOLN
INTRAMUSCULAR | Status: AC
  Filled 2015-06-03: qty 2

## 2015-06-03 MED ORDER — LIDOCAINE HCL (PF) 1 % IJ SOLN
INTRAMUSCULAR | Status: AC
Start: 2015-06-03 — End: 2015-06-03
  Filled 2015-06-03: qty 30

## 2015-06-03 MED ORDER — ASPIRIN 81 MG PO CHEW
81.0000 mg | CHEWABLE_TABLET | ORAL | Status: AC
  Administered 2015-06-03: 81 mg via ORAL
  Filled 2015-06-03: qty 1

## 2015-06-03 MED ORDER — MAGNESIUM HYDROXIDE 400 MG/5ML PO SUSP
30.0000 mL | Freq: Every day | ORAL | Status: DC | PRN
  Administered 2015-06-03 (×2): 30 mL via ORAL
  Filled 2015-06-03: qty 30

## 2015-06-03 MED ORDER — SODIUM CHLORIDE 0.9 % IV SOLN
250.0000 mg | INTRAVENOUS | Status: DC | PRN
  Administered 2015-06-03: 1.75 mg/kg/h via INTRAVENOUS

## 2015-06-03 MED ORDER — VERAPAMIL HCL 2.5 MG/ML IV SOLN
INTRAVENOUS | Status: DC | PRN
  Administered 2015-06-03: 10:00:00 via INTRA_ARTERIAL

## 2015-06-03 MED ORDER — MAGNESIUM HYDROXIDE 400 MG/5ML PO SUSP
15.0000 mL | Freq: Every day | ORAL | Status: DC | PRN
  Filled 2015-06-03: qty 30

## 2015-06-03 MED ORDER — BIVALIRUDIN 250 MG IV SOLR
INTRAVENOUS | Status: AC
  Filled 2015-06-03: qty 250

## 2015-06-03 SURGICAL SUPPLY — 16 items
BALLN EUPHORA RX 2.5X15 (BALLOONS) ×2
BALLN ~~LOC~~ EUPHORA RX 3.5X12 (BALLOONS) ×2
BALLN ~~LOC~~ EUPHORA RX 3.75X8 (BALLOONS) ×2
BALLOON EUPHORA RX 2.5X15 (BALLOONS) ×1 IMPLANT
BALLOON ~~LOC~~ EUPHORA RX 3.5X12 (BALLOONS) ×1 IMPLANT
BALLOON ~~LOC~~ EUPHORA RX 3.75X8 (BALLOONS) ×1 IMPLANT
CATH VISTA GUIDE 6FR XBLAD3.5 (CATHETERS) ×2 IMPLANT
GLIDESHEATH SLEND SS 6F .021 (SHEATH) ×2 IMPLANT
KIT ENCORE 26 ADVANTAGE (KITS) ×2 IMPLANT
KIT HEART LEFT (KITS) ×2 IMPLANT
PACK CARDIAC CATHETERIZATION (CUSTOM PROCEDURE TRAY) ×2 IMPLANT
STENT PROMUS PREM MR 3.5X24 (Permanent Stent) ×2 IMPLANT
TRANSDUCER W/STOPCOCK (MISCELLANEOUS) ×2 IMPLANT
TUBING CIL FLEX 10 FLL-RA (TUBING) ×2 IMPLANT
WIRE ASAHI PROWATER 180CM (WIRE) ×2 IMPLANT
WIRE SAFE-T 1.5MM-J .035X260CM (WIRE) ×2 IMPLANT

## 2015-06-03 NOTE — Interval H&P Note (Signed)
History and Physical Interval Note:  06/03/2015 9:11 AM  Rene Kocher  has presented today for surgery, with the diagnosis of coronary disease  The various methods of treatment have been discussed with the patient and family. After consideration of risks, benefits and other options for treatment, the patient has consented to  Procedure(s): Coronary Stent Intervention (N/A) as a surgical intervention .  The patient's history has been reviewed, patient examined, no change in status, stable for surgery.  I have reviewed the patient's chart and labs.  Questions were answered to the patient's satisfaction.     Cath Lab Visit (complete for each Cath Lab visit)  Clinical Evaluation Leading to the Procedure:   ACS: Yes.    Non-ACS:    Anginal Classification: CCS III  Anti-ischemic medical therapy: Minimal Therapy (1 class of medications)  Non-Invasive Test Results: No non-invasive testing performed  Prior CABG: No previous CABG       Collier Salina [facility] 06/03/2015 9:11 AM

## 2015-06-03 NOTE — Progress Notes (Signed)
    Subjective:  No chest pain, no SOB.   Objective:  Vital Signs in the last 24 hours: Temp:  [97.8 F (36.6 C)-99.6 F (37.6 C)] 98.2 F (36.8 C) (07/05 0747) Pulse Rate:  [55-67] 61 (07/05 0747) Resp:  [13-26] 26 (07/05 0747) BP: (110-156)/(51-89) 110/53 mmHg (07/05 0747) SpO2:  [94 %-100 %] 94 % (07/05 0747) Weight:  [144 lb 10 oz (65.6 kg)] 144 lb 10 oz (65.6 kg) (07/05 0500)  Intake/Output from previous day: 07/04 0701 - 07/05 0700 In: 1507.2 [P.O.:840; I.V.:667.2] Out: 1050 [Urine:1050]   Physical Exam: General: Well developed, well nourished, in no acute distress. Head:  Normal Neck: supple Lungs: CTA Heart: RRR Abdomen: soft, non-tender, no masses Extremities: No edema. No hematoma right wrist cath site Neurologic: Alert and oriented x 3.    Lab Results:  Recent Labs  06/01/15 1728 06/02/15 0222  WBC 11.4* 11.9*  HGB 15.7 14.5  PLT 229 219    Recent Labs  06/01/15 1728 06/02/15 0222  NA 134* 133*  K 4.7 4.4  CL 98* 98*  CO2 25 23  GLUCOSE 311* 226*  BUN 14 12  CREATININE 1.01 0.87     Recent Labs  06/01/15 1728  PROT 6.5  ALBUMIN 4.1  AST 39  ALT 15*  ALKPHOS 99  BILITOT 0.5    Imaging: Dg Chest 2 View  06/01/2015   CLINICAL DATA:  Chest pain. Shortness of breath. Nonproductive cough.  EXAM: CHEST  2 VIEW  COMPARISON:  06/16/2013  FINDINGS: Heart size and pulmonary vascularity are normal. The lungs are clear. No effusions.  There is tortuosity and calcification of the thoracic aorta. There is a large hiatal hernia.  Numerous old bilateral rib fractures.  IMPRESSION: No acute abnormality. Aortic atherosclerosis. Large distended hiatal hernia.   Electronically Signed   By: Lorriane Shire M.D.   On: 06/01/2015 17:58     Cardiac Studies:  LAD 80%, RCA post DES. 45% EF  Scheduled Meds: . aspirin  81 mg Oral Daily  . atorvastatin  80 mg Oral q1800  . glipiZIDE  5 mg Oral BID AC  . insulin aspart  0-9 Units Subcutaneous TID WC  .  lisinopril  5 mg Oral Daily  . metoprolol tartrate  25 mg Oral BID  . sodium chloride  3 mL Intravenous Q12H  . sodium chloride  3 mL Intravenous Q12H  . sodium chloride  3 mL Intravenous Q12H  . ticagrelor  90 mg Oral BID   Continuous Infusions: . sodium chloride 1 mL/kg/hr (06/02/15 2013)  . sodium chloride     PRN Meds:.sodium chloride, sodium chloride, sodium chloride, acetaminophen, magnesium hydroxide, ondansetron (ZOFRAN) IV, oxyCODONE-acetaminophen, sodium chloride, sodium chloride, sodium chloride  Assessment/Plan:  74 year old with INF STEMI and residual LAD disease  STEMI-continue aspirin, brilinta, statin, beta blocker and ACE inhibitor.  Mild LV systolic dysfunction-continue ACE inhibitor and beta blocker. Will not advance beta blocker as patient had a 3.2 second pause while asleep last evening. Follow volume status with hydration.  CAD-residual LAD disease-for PCI today. Repeat labs now prior to procedure.  DM-follow CBGs. Continue present medications. Hold Glucophage for 48 hours following procedure.  Possibly home 7/6    Kirk Ruths 06/03/2015, 8:29 AM

## 2015-06-03 NOTE — H&P (View-Only) (Signed)
    Subjective:  No chest pain, no SOB.   Objective:  Vital Signs in the last 24 hours: Temp:  [97.8 F (36.6 C)-99.6 F (37.6 C)] 98.2 F (36.8 C) (07/05 0747) Pulse Rate:  [55-67] 61 (07/05 0747) Resp:  [13-26] 26 (07/05 0747) BP: (110-156)/(51-89) 110/53 mmHg (07/05 0747) SpO2:  [94 %-100 %] 94 % (07/05 0747) Weight:  [144 lb 10 oz (65.6 kg)] 144 lb 10 oz (65.6 kg) (07/05 0500)  Intake/Output from previous day: 07/04 0701 - 07/05 0700 In: 1507.2 [P.O.:840; I.V.:667.2] Out: 1050 [Urine:1050]   Physical Exam: General: Well developed, well nourished, in no acute distress. Head:  Normal Neck: supple Lungs: CTA Heart: RRR Abdomen: soft, non-tender, no masses Extremities: No edema. No hematoma right wrist cath site Neurologic: Alert and oriented x 3.    Lab Results:  Recent Labs  06/01/15 1728 06/02/15 0222  WBC 11.4* 11.9*  HGB 15.7 14.5  PLT 229 219    Recent Labs  06/01/15 1728 06/02/15 0222  NA 134* 133*  K 4.7 4.4  CL 98* 98*  CO2 25 23  GLUCOSE 311* 226*  BUN 14 12  CREATININE 1.01 0.87     Recent Labs  06/01/15 1728  PROT 6.5  ALBUMIN 4.1  AST 39  ALT 15*  ALKPHOS 99  BILITOT 0.5    Imaging: Dg Chest 2 View  06/01/2015   CLINICAL DATA:  Chest pain. Shortness of breath. Nonproductive cough.  EXAM: CHEST  2 VIEW  COMPARISON:  06/16/2013  FINDINGS: Heart size and pulmonary vascularity are normal. The lungs are clear. No effusions.  There is tortuosity and calcification of the thoracic aorta. There is a large hiatal hernia.  Numerous old bilateral rib fractures.  IMPRESSION: No acute abnormality. Aortic atherosclerosis. Large distended hiatal hernia.   Electronically Signed   By: Lorriane Shire M.D.   On: 06/01/2015 17:58     Cardiac Studies:  LAD 80%, RCA post DES. 45% EF  Scheduled Meds: . aspirin  81 mg Oral Daily  . atorvastatin  80 mg Oral q1800  . glipiZIDE  5 mg Oral BID AC  . insulin aspart  0-9 Units Subcutaneous TID WC  .  lisinopril  5 mg Oral Daily  . metoprolol tartrate  25 mg Oral BID  . sodium chloride  3 mL Intravenous Q12H  . sodium chloride  3 mL Intravenous Q12H  . sodium chloride  3 mL Intravenous Q12H  . ticagrelor  90 mg Oral BID   Continuous Infusions: . sodium chloride 1 mL/kg/hr (06/02/15 2013)  . sodium chloride     PRN Meds:.sodium chloride, sodium chloride, sodium chloride, acetaminophen, magnesium hydroxide, ondansetron (ZOFRAN) IV, oxyCODONE-acetaminophen, sodium chloride, sodium chloride, sodium chloride  Assessment/Plan:  74 year old with INF STEMI and residual LAD disease  STEMI-continue aspirin, brilinta, statin, beta blocker and ACE inhibitor.  Mild LV systolic dysfunction-continue ACE inhibitor and beta blocker. Will not advance beta blocker as patient had a 3.2 second pause while asleep last evening. Follow volume status with hydration.  CAD-residual LAD disease-for PCI today. Repeat labs now prior to procedure.  DM-follow CBGs. Continue present medications. Hold Glucophage for 48 hours following procedure.  Possibly home 7/6    Kirk Ruths 06/03/2015, 8:29 AM

## 2015-06-03 NOTE — Care Management (Signed)
Important Message  Patient Details  Name: [name] MRN: [id number] Date of Birth: 10/16/1941   Medicare Important Message Given:  Yes-second notification given    Loann Quill 06/03/2015, 10:21 AM

## 2015-06-03 NOTE — Progress Notes (Addendum)
CM provided pt with Brilinta booklet with 30 day free card enclosed. Pt verbally stated understanding of card usage. Pt uses [facility]  [place] , [phone].  Pt stated will use 30 day free card @ [facility] pharmacy. CM called to confirm medication is  in stock and made pt aware.  Pt states he typically receives his medication from the [place]. Pt's refill [facility] @ [phone] for  pt's PCP (Dr. Dierdre Searles)  to rewrite in order for pt to obtain medication through the [place]. Follow-up calls concerning  faxes sent to the [place], [phone]. No other needs identified @ present time. CM to f/u with disposition needs. Whitman Hero RN, [place] [phone]

## 2015-06-03 NOTE — Progress Notes (Signed)
TR BAND REMOVAL  LOCATION:    right radial  DEFLATED PER PROTOCOL:    Yes.    TIME BAND OFF / DRESSING APPLIED:    1715   SITE UPON ARRIVAL:    Level 0  SITE AFTER BAND REMOVAL:    Level 0  REVERSE ALLEN'S TEST:     positive  CIRCULATION SENSATION AND MOVEMENT:    Within Normal Limits   Yes.    COMMENTS:   Tolerated procedure well

## 2015-06-04 ENCOUNTER — Encounter (HOSPITAL_COMMUNITY): Payer: Self-pay | Admitting: Physician Assistant

## 2015-06-04 LAB — CBC
HCT: 38.4 % — ABNORMAL LOW (ref 39.0–52.0)
Hemoglobin: 13.3 g/dL (ref 13.0–17.0)
MCH: 30.1 pg (ref 26.0–34.0)
MCHC: 34.6 g/dL (ref 30.0–36.0)
MCV: 86.9 fL (ref 78.0–100.0)
Platelets: 180 10*3/uL (ref 150–400)
RBC: 4.42 MIL/uL (ref 4.22–5.81)
RDW: 13.3 % (ref 11.5–15.5)
WBC: 9.2 10*3/uL (ref 4.0–10.5)

## 2015-06-04 LAB — BASIC METABOLIC PANEL
Anion gap: 8 (ref 5–15)
BUN: 13 mg/dL (ref 6–20)
CHLORIDE: 105 mmol/L (ref 101–111)
CO2: 25 mmol/L (ref 22–32)
Calcium: 9.3 mg/dL (ref 8.9–10.3)
Creatinine, Ser: 0.93 mg/dL (ref 0.61–1.24)
GFR calc non Af Amer: >60 mL/min (ref >60)
GLUCOSE: 167 mg/dL — AB (ref 65–99)
POTASSIUM: 4.3 mmol/L (ref 3.5–5.1)
SODIUM: 138 mmol/L (ref 135–145)

## 2015-06-04 LAB — HIT PANEL (HEPARIN AB + SRA)
Heparin Induced Plt Ab: 0.18 OD (ref 0.000–0.400)
SRA .2 IU/mL UFH Ser-aCnc: <1 % (ref 0–20)
SRA 100IU/mL UFH Ser-aCnc: 7 % (ref 0–20)

## 2015-06-04 LAB — GLUCOSE, CAPILLARY: Glucose-Capillary: 170 mg/dL — ABNORMAL HIGH (ref 65–99)

## 2015-06-04 MED ORDER — METFORMIN HCL 500 MG PO TABS: 1000.0000 mg | ORAL_TABLET | Freq: Two times a day (BID) | ORAL | Status: AC

## 2015-06-04 MED ORDER — ATORVASTATIN CALCIUM 80 MG PO TABS: 80.0000 mg | ORAL_TABLET | Freq: Every day | ORAL | Status: AC

## 2015-06-04 MED ORDER — NITROGLYCERIN 0.4 MG SL SUBL: 0.4000 mg | SUBLINGUAL_TABLET | SUBLINGUAL | Status: DC | PRN

## 2015-06-04 MED ORDER — NITROGLYCERIN 0.4 MG SL SUBL: 0.4000 mg | SUBLINGUAL_TABLET | SUBLINGUAL | Status: AC | PRN

## 2015-06-04 MED ORDER — METOPROLOL TARTRATE 25 MG PO TABS: 25.0000 mg | ORAL_TABLET | Freq: Two times a day (BID) | ORAL | Status: AC

## 2015-06-04 MED ORDER — TICAGRELOR 90 MG PO TABS: 90.0000 mg | ORAL_TABLET | Freq: Two times a day (BID) | ORAL | Status: DC

## 2015-06-04 MED ORDER — METOPROLOL TARTRATE 25 MG PO TABS: 25.0000 mg | ORAL_TABLET | Freq: Two times a day (BID) | ORAL | Status: DC

## 2015-06-04 MED ORDER — TICAGRELOR 90 MG PO TABS: 90.0000 mg | ORAL_TABLET | Freq: Two times a day (BID) | ORAL | Status: AC

## 2015-06-04 MED ORDER — LISINOPRIL 5 MG PO TABS: 5.0000 mg | ORAL_TABLET | Freq: Every day | ORAL | Status: DC

## 2015-06-04 MED ORDER — HYDROXYZINE HCL 10 MG PO TABS: 10.0000 mg | ORAL_TABLET | Freq: Four times a day (QID) | ORAL | Status: AC | PRN

## 2015-06-04 MED ORDER — ATORVASTATIN CALCIUM 80 MG PO TABS: 80.0000 mg | ORAL_TABLET | Freq: Every day | ORAL | Status: DC

## 2015-06-04 MED ORDER — GLIPIZIDE 5 MG PO TABS: 10.0000 mg | ORAL_TABLET | Freq: Two times a day (BID) | ORAL | Status: AC

## 2015-06-04 MED ORDER — LISINOPRIL 5 MG PO TABS: 5.0000 mg | ORAL_TABLET | Freq: Every day | ORAL | Status: AC

## 2015-06-04 MED FILL — Heparin Sodium (Porcine) 2 Unit/ML in Sodium Chloride 0.9%: INTRAMUSCULAR | Qty: 1000 | Status: AC

## 2015-06-04 NOTE — Discharge Summary (Signed)
CARDIOLOGY DISCHARGE SUMMARY   Patient ID: [name] MRN: [id number] DOB/AGE: 06-09-1941 74 y.o.  Admit date: 06/01/2015 Discharge date: 06/04/2015  PCP: [facility], [place]  Primary Cardiologist: Dr. Stanford Breed  Primary Discharge Diagnosis:  Myocardial infarction, inferior, acute, initial episode Secondary Discharge Diagnosis:    Atherosclerosis of aorta   Type 2 diabetes mellitus with peripheral neuropathy   ST elevation myocardial infarction (STEMI) involving right coronary artery in recovery phase  Procedures: Cardiac catheterization, coronary arteriogram, left ventriculogram, DES to the RCA, stage re-catheterization with DES to the LAD, 2-D echocardiogram  Hospital Course: Cai Anfinson is a 74 y.o. male with a history of diabetes with peripheral neuropathy, hypertension, hyperlipidemia, and tobacco abuse.He had recurrent chest discomfort and on the day of admission, it was persistent so EMS was called. His ECG had inferior ST elevation consistent with an inferior STEMI. He was taken emergently to the Cath Lab for further evaluation and treatment.  His initial catheterization showed significant two-vessel disease. The culprit lesion was the RCA which was treated with a drug-eluting stent. He was admitted to ICU.  His statin was increased from Lipitor 40 mg to Lipitor 80 mg daily. His beta blocker was changed from propranolol to metoprolol. His ACE inhibitor was decreased to allow for an increase in beta blocker therapy. He was managed medically and remained pain-free.  He had some nonsustained VT on telemetry, this was felt likely a reperfusion arrhythmia. His beta blocker was increased as his blood pressure would tolerate.  His diabetes was managed with sliding scale insulin as well as his home dose of glipizide. His metformin and pioglitazone were held.  He had some left ventricular dysfunction, with an EF of 45% by echocardiogram. His volume status  was watched carefully and he had no signs of overload.  He was taken back to the cath lab on 06/03/2015 and had PCI to the LAD with a drug-eluting stent. He tolerated the procedure well.  He was seen by cardiac rehabilitation and educated on MI restrictions, heart-healthy lifestyle modifications and exercise guidelines.  On 06/04/2015, he was seen by Dr. Stanford Breed and all data were reviewed. He ambulated with cardiac rehabilitation and did well. No further inpatient workup is indicated and he is considered stable for discharge, to follow-up as an outpatient.  He was given a printed prescription for Reglan 10 with no refills and is to get 30 days for free. He was given 2 copies of paper prescriptions for all medications. He will take one copy to a local pharmacy for medications and take the second copy to the [place] where he usually gets his medications in the mail.  Labs:   Lab Results  Component Value Date   WBC 9.2 06/04/2015   HGB 13.3 06/04/2015   HCT 38.4* 06/04/2015   MCV 86.9 06/04/2015   PLT 180 06/04/2015    Recent Labs Lab 06/01/15 1728  06/04/15 0555  NA 134*  < > 138  K 4.7  < > 4.3  CL 98*  < > 105  CO2 25  < > 25  BUN 14  < > 13  CREATININE 1.01  < > 0.93  CALCIUM 10.2  < > 9.3  PROT 6.5  --   --   BILITOT 0.5  --   --   ALKPHOS 99  --   --   ALT 15*  --   --   AST 39  --   --   GLUCOSE 311*  < >  167*  < > = values in this interval not displayed.  Recent Labs  06/01/15 1802  INR 1.06     Lab Results  Component Value Date   TROPONINI- POC  3.96* 06/04/2015   Radiology: Dg Chest 2 View 06/01/2015   CLINICAL DATA:  Chest pain. Shortness of breath. Nonproductive cough.  EXAM: CHEST  2 VIEW  COMPARISON:  06/16/2013  FINDINGS: Heart size and pulmonary vascularity are normal. The lungs are clear. No effusions.  There is tortuosity and calcification of the thoracic aorta. There is a large hiatal hernia.  Numerous old bilateral rib fractures.  IMPRESSION: No acute  abnormality. Aortic atherosclerosis. Large distended hiatal hernia.   Electronically Signed   By: Lorriane Shire M.D.   On: 06/01/2015 17:58   Cardiac Cath: 06/01/2015 Conclusions  1st Mrg lesion, 30% stenosed.  Dist Cx lesion, 30% stenosed.  Prox LAD to Mid LAD lesion, 85% stenosed.  There is mild left ventricular systolic dysfunction.  Prox RCA lesion, 100% stenosed. There is a 0% residual stenosis post intervention.  A PROMUS PREM MR 3.5X16 drug-eluting stent was placed. Left Ventricle The left ventricular size is normal. There is mild left ventricular systolic dysfunction. The left ventricular ejection fraction is 45-50% by visual estimate. There are wall motion abnormalities in the left ventricle. mid to distal inferior akinesis. There are segmental wall motion abnormalities in the left ventricle. Mitral Valve There is no mitral valve regurgitation.  1. 2 vessel obstructive CAD with   100% proximal RCA- culprit  85% proximal LAD 2. Mild LV dysfunction with inferior wall motion abnormality. 3. Successful stenting of the proximal RCA with a PROMUS PREM MR 3.5X16 DES. Distal thrombus embolization with persistent occlusion of the very distal PDA. Recommendations: DAPT for one year. Aggressive risk factor modification. Plan return to lab on Tuesday for PCI of the LAD.  Cardiac Cath: 06/03/2015 Conclusions  A drug-eluting stent was placed.  1st Mrg lesion, 30% stenosed.  Dist Cx lesion, 30% stenosed.  Prox LAD to Mid LAD lesion, 85% stenosed. There is a 0% residual stenosis post intervention.  A PROMUS PREM MR 3.5X24 drug-eluting stent was placed. 1. Successful stenting of the proximal to mid LAD with a PROMUS PREM MR 3.5X24 DES. Recommendation: DAPT for one year. Anticipate DC in am.    EKG: 06/04/2015 Sinus rhythm, rate 60 Inferior and lateral T wave inversions, consistent with recent MI  Echo: 06/02/2015 Conclusions - Left ventricle: The cavity size was  normal. Wall thickness was increased increased in a pattern of mild to moderate LVH. Systolic function was normal. The estimated ejection fraction was in the range of 55% to 60%. Doppler parameters are consistent with abnormal left ventricular relaxation (grade 1 diastolic dysfunction). - Aortic valve: Mildly calcified annulus. Trileaflet; mildly thickened leaflets. Valve area (VTI): 2.67 cm^2. Valve area (Vmax): 2.7 cm^2. - Mitral valve: Mildly calcified annulus. Mildly thickened leaflets - Right ventricle: The cavity size was mildly dilated. - Technically difficult study.   FOLLOW UP PLANS AND APPOINTMENTS Allergies  Allergen Reactions  . Penicillins Hives and Rash     Medication List    STOP taking these medications        propranolol ER 80 MG 24 hr capsule  Commonly known as:  INDERAL LA      TAKE these medications        aspirin EC 81 MG tablet  Take 81 mg by mouth at bedtime.     atorvastatin 80 MG tablet  Commonly known  as:  LIPITOR  Take 1 tablet (80 mg total) by mouth daily at 6 PM.     butalbital-acetaminophen-caffeine 50-325-40 MG per tablet  Commonly known as:  FIORICET, ESGIC  Take 1 tablet by mouth 2 (two) times daily as needed for headache.     Cholecalciferol 2000 UNITS Caps  Take 2,000 Units by mouth 2 (two) times daily.     cyanocobalamin 500 MCG tablet  Take 1,000 mcg by mouth daily.     dicyclomine 10 MG capsule  Commonly known as:  BENTYL  Take 1 capsule (10 mg total) by mouth 4 (four) times daily as needed (abdominal pain).     ferrous sulfate 325 (65 FE) MG tablet  Take 325 mg by mouth daily with breakfast.     Fish Oil 1000 MG Caps  Take 1,000 mg by mouth 3 (three) times daily before meals.     gabapentin 400 MG capsule  Commonly known as:  NEURONTIN  Take 400 mg by mouth daily.     glipiZIDE 5 MG tablet  Commonly known as:  GLUCOTROL  Take 2 tablets (10 mg total) by mouth 2 (two) times daily before a meal. 30  minutes prior to a meal     hydrOXYzine 10 MG tablet  Commonly known as:  ATARAX/VISTARIL  Take 1 tablet (10 mg total) by mouth every 6 (six) hours as needed for itching.     insulin detemir 100 UNIT/ML injection  Commonly known as:  LEVEMIR  Inject 40 Units into the skin at bedtime.     lisinopril 5 MG tablet  Commonly known as:  PRINIVIL,ZESTRIL  Take 1 tablet (5 mg total) by mouth daily. Hold for SBP <100     Magnesium Oxide 420 MG Tabs  Take 2 tablets by mouth 2 (two) times daily.     metFORMIN 500 MG tablet  Commonly known as:  GLUCOPHAGE  Take 2 tablets (1,000 mg total) by mouth 2 (two) times daily with a meal. Hold for 48 hours, restart on 06/07/2015.     methocarbamol 500 MG tablet  Commonly known as:  ROBAXIN  Take 1 tablet (500 mg total) by mouth every 8 (eight) hours as needed.     metoprolol tartrate 25 MG tablet  Commonly known as:  LOPRESSOR  Take 1 tablet (25 mg total) by mouth 2 (two) times daily.     niacin 500 MG tablet  Commonly known as:  SLO-NIACIN  Take 1,000 mg by mouth at bedtime.     nitroGLYCERIN 0.4 MG SL tablet  Commonly known as:  NITROSTAT  Place 1 tablet (0.4 mg total) under the tongue every 5 (five) minutes as needed for chest pain.     pantoprazole 40 MG tablet  Commonly known as:  PROTONIX  Take 40 mg by mouth daily.     pioglitazone 45 MG tablet  Commonly known as:  ACTOS  Take 45 mg by mouth daily.     PRESCRIPTION MEDICATION  Inject 42 Units into the skin at bedtime. Insulin from [place]     ticagrelor 90 MG Tabs tablet  Commonly known as:  BRILINTA  Take 1 tablet (90 mg total) by mouth 2 (two) times daily.     traMADol 50 MG tablet  Commonly known as:  ULTRAM  Take 1 tablet (50 mg total) by mouth every 6 (six) hours as needed for pain.     vitamin C 1000 MG tablet  Take 500 mg by mouth daily.  Discharge Instructions    Amb Referral to Cardiac Rehabilitation    Complete by:  As directed   Congestive Heart  Failure: If diagnosis is Heart Failure, patient MUST meet each of the CMS criteria: 1. Left Ventricular Ejection Fraction </= 35% 2. NYHA class II-IV symptoms despite being on optimal heart failure therapy for at least 6 weeks. 3. Stable = have not had a recent (<6 weeks) or planned (<6 months) major cardiovascular hospitalization or procedure  Program Details: - Physician supervised classes - 1-3 classes per week over a 12-18 week period, generally for a total of 36 sessions  Physician Certification: I certify that the above Cardiac Rehabilitation treatment is medically necessary and is medically approved by me for treatment of this patient. The patient is willing and cooperative, able to ambulate and medically stable to participate in exercise rehabilitation. The participant's progress and Individualized Treatment Plan will be reviewed by the Medical Director, Cardiac Rehab staff and as indicated by the Referring/Ordering Physician.  Diagnosis:   PCI Myocardial Infarction       Diet - low sodium heart healthy    Complete by:  As directed      Diet Carb Modified    Complete by:  As directed      Increase activity slowly    Complete by:  As directed           Follow-up Information    Follow up with HAGER, BRYAN, PA-C On 07/08/2015.   Specialties:  Physician Assistant, Radiology, Interventional Cardiology   Why:  See provider at 3:00 pm, please arrive 15 minutes early for paperwork. If an earlier appointment becomes available, they will call you.    Contact information:   [address] [place] [address] [phone]       BRING ALL MEDICATIONS WITH YOU TO FOLLOW UP APPOINTMENTS  Time spent with patient to include physician time: 42 min Signed: Rosaria Ferries, PA-C 06/04/2015, 10:10 AM Co-Sign MD

## 2015-06-04 NOTE — Progress Notes (Signed)
    Subjective:  No chest pain, no SOB.   Objective:  Vital Signs in the last 24 hours: Temp:  [97.7 F (36.5 C)-99.1 F (37.3 C)] 98 F (36.7 C) (07/06 0400) Pulse Rate:  [0-151] 53 (07/06 0400) Resp:  [0-179] 16 (07/06 0400) BP: (86-150)/(56-83) 124/59 mmHg (07/06 0400) SpO2:  [0 %-99 %] 96 % (07/06 0400) Weight:  [157 lb 13.6 oz (71.6 kg)] 157 lb 13.6 oz (71.6 kg) (07/06 0400)  Intake/Output from previous day: 07/05 0701 - 07/06 0700 In: 1028.2 [P.O.:960; I.V.:68.2] Out: 800 [Urine:800]   Physical Exam: General: Well developed, well nourished, in no acute distress. Head:  Normal Neck: supple Lungs: CTA Heart: RRR Abdomen: soft, non-tender, no masses Extremities: No edema. No hematoma right wrist cath site Neurologic: Alert and oriented x 3.    Lab Results:  Recent Labs  06/03/15 1715 06/04/15 0555  WBC 8.3 9.2  HGB 12.8* 13.3  PLT 171 180    Recent Labs  06/03/15 1715 06/04/15 0555  NA 136 138  K 4.2 4.3  CL 104 105  CO2 25 25  GLUCOSE 208* 167*  BUN 17 13  CREATININE 0.84 0.93     Recent Labs  06/01/15 1728  PROT 6.5  ALBUMIN 4.1  AST 39  ALT 15*  ALKPHOS 99  BILITOT 0.5    Imaging: No results found.   Cardiac Studies:  LAD 80%, RCA post DES. 45% EF  Scheduled Meds: . aspirin  81 mg Oral Daily  . atorvastatin  80 mg Oral q1800  . glipiZIDE  5 mg Oral BID AC  . insulin aspart  0-9 Units Subcutaneous TID WC  . lisinopril  5 mg Oral Daily  . metoprolol tartrate  25 mg Oral BID  . sodium chloride  3 mL Intravenous Q12H  . sodium chloride  3 mL Intravenous Q12H  . sodium chloride  3 mL Intravenous Q12H  . ticagrelor  90 mg Oral BID   Continuous Infusions:   PRN Meds:.sodium chloride, sodium chloride, sodium chloride, acetaminophen, magnesium hydroxide, ondansetron (ZOFRAN) IV, oxyCODONE-acetaminophen, sodium chloride, sodium chloride, sodium chloride  Assessment/Plan:  74 year old with INF STEMI and residual LAD  disease  STEMI s/p PCI RCA-continue aspirin, brilinta, statin, beta blocker and ACE inhibitor.  Mild LV systolic dysfunction-continue ACE inhibitor and beta blocker.   CAD-residual LAD disease s/p PCI. Continue present meds  DM- Continue preadmission medications at DC. Hold Glucophage for 48 hours following recent procedure.  > 30 min PA and physician time D2    Kirk Ruths 06/04/2015, 8:27 AM
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
## 2015-06-04 NOTE — Progress Notes (Signed)
CM made several attempts in faxing prescriptions(refill) to [facility], @ [phone], unsuccessfully. CM called  the call center @ [phone] and learned that they are experiencing problems with the fax phone line system, and the  secretary connected CM directly to [facility]. Dr.Liazardo asked if [facility] would [facility] and pt agreed he would. Prescriptions are to be given to pt @ d/c per nurse with d/c instructions. Whitman Hero RN,BSN,CM [phone]

## 2015-06-04 NOTE — Discharge Instructions (Signed)
PLEASE REMEMBER TO BRING ALL OF YOUR MEDICATIONS TO EACH OF YOUR FOLLOW-UP OFFICE VISITS.  PLEASE ATTEND ALL SCHEDULED FOLLOW-UP APPOINTMENTS.   Activity: Increase activity slowly as tolerated. You may shower, but no soaking baths (or swimming) for 1 week. No driving for 1 week. No lifting over 5 lbs for 1 week. No sexual activity for 1 week.   Wound Care: You may wash cath site gently with soap and water. Keep cath site clean and dry. If you notice pain, swelling, bleeding or pus at your cath site, please call [phone].    Cardiac Cath Site Care Refer to this sheet in the next few weeks. These instructions provide you with information on caring for yourself after your procedure. Your caregiver may also give you more specific instructions. Your treatment has been planned according to current medical practices, but problems sometimes occur. Call your caregiver if you have any problems or questions after your procedure. HOME CARE INSTRUCTIONS  You may shower 24 hours after the procedure. Remove the bandage (dressing) and gently wash the site with plain soap and water. Gently pat the site dry.   Do not apply powder or lotion to the site.   Do not sit in a bathtub, swimming pool, or whirlpool for 5 to 7 days.   No bending, squatting, or lifting anything over 10 pounds (4.5 kg) as directed by your caregiver.   Inspect the site at least twice daily.   Do not drive home if you are discharged the same day of the procedure. Have someone else drive you.   You may drive 24 hours after the procedure unless otherwise instructed by your caregiver.  What to expect:  Any bruising will usually fade within 1 to 2 weeks.   Blood that collects in the tissue (hematoma) may be painful to the touch. It should usually decrease in size and tenderness within 1 to 2 weeks.  SEEK IMMEDIATE MEDICAL CARE IF:  You have unusual pain at the site or down the affected limb.   You have redness, warmth, swelling,  or pain at the site.   You have drainage (other than a small amount of blood on the dressing).   You have chills.   You have a fever or persistent symptoms for more than 72 hours.   You have a fever and your symptoms suddenly get worse.   Your leg becomes pale, cool, tingly, or numb.   You have heavy bleeding from the site. Hold pressure on the site.  Document Released: 12/18/2010 Document Revised: 11/04/2011 Document Reviewed: 12/18/2010 [facility] Patient Information 2012 [place].

## 2015-06-05 ENCOUNTER — Telehealth: Payer: Self-pay | Admitting: Cardiology

## 2015-06-05 MED FILL — Heparin Sodium (Porcine) 2 Unit/ML in Sodium Chloride 0.9%: INTRAMUSCULAR | Qty: 1000 | Status: AC

## 2015-06-05 NOTE — Telephone Encounter (Signed)
Both numbers for patient is not available Called emergency  Contact [name] - no answer Called emergency contact -[name] and [name] - left message on home number to call back

## 2015-06-05 NOTE — Telephone Encounter (Signed)
TOC attempted at listed numbers.

## 2015-06-05 NOTE — Telephone Encounter (Signed)
D/C phone call. Appt on 08/09/116 w/ Tarri Fuller at 3pm.. Thanks

## 2015-06-17 ENCOUNTER — Telehealth: Payer: Self-pay | Admitting: *Deleted

## 2015-06-17 NOTE — Telephone Encounter (Signed)
Rehab order faxed

## 2015-07-08 ENCOUNTER — Ambulatory Visit: Payer: Non-veteran care | Admitting: Physician Assistant
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
## 2016-09-29 DEATH — deceased
